# Patient Record
Sex: Male | Born: 1950 | Race: White | Hispanic: No | Marital: Married | Smoking: Never smoker
Health system: Southern US, Community
[De-identification: ages and names within clinical notes are randomized; demographics above are authoritative.]

## PROBLEM LIST (undated history)

## (undated) DIAGNOSIS — K635 Polyp of colon: Secondary | ICD-10-CM

## (undated) DIAGNOSIS — K439 Ventral hernia without obstruction or gangrene: Secondary | ICD-10-CM

## (undated) DIAGNOSIS — E291 Testicular hypofunction: Secondary | ICD-10-CM

## (undated) DIAGNOSIS — Z860100 Personal history of colon polyps, unspecified: Secondary | ICD-10-CM

## (undated) DIAGNOSIS — R55 Syncope and collapse: Secondary | ICD-10-CM

## (undated) DIAGNOSIS — F419 Anxiety disorder, unspecified: Secondary | ICD-10-CM

## (undated) DIAGNOSIS — B958 Unspecified staphylococcus as the cause of diseases classified elsewhere: Secondary | ICD-10-CM

## (undated) DIAGNOSIS — I10 Essential (primary) hypertension: Secondary | ICD-10-CM

## (undated) DIAGNOSIS — E785 Hyperlipidemia, unspecified: Secondary | ICD-10-CM

## (undated) HISTORY — DX: Ventral hernia without obstruction or gangrene: K43.9

## (undated) HISTORY — PX: COLONOSCOPY W/ BIOPSIES: SHX1374

## (undated) HISTORY — DX: Personal history of colon polyps, unspecified: Z86.0100

## (undated) HISTORY — DX: Anxiety disorder, unspecified: F41.9

## (undated) HISTORY — PX: LAPAROSCOPY: SHX197

## (undated) HISTORY — DX: Unspecified staphylococcus as the cause of diseases classified elsewhere: B95.8

## (undated) HISTORY — DX: Personal history of colonic polyps: Z86.010

## (undated) HISTORY — DX: Testicular hypofunction: E29.1

## (undated) HISTORY — DX: Hyperlipidemia, unspecified: E78.5

## (undated) HISTORY — DX: Essential (primary) hypertension: I10

## (undated) HISTORY — DX: Polyp of colon: K63.5

## (undated) HISTORY — PX: OTHER SURGICAL HISTORY: SHX169

---

## 1988-01-01 HISTORY — PX: OTHER SURGICAL HISTORY: SHX169

## 2004-11-22 ENCOUNTER — Ambulatory Visit: Payer: Self-pay | Admitting: Internal Medicine

## 2005-02-28 ENCOUNTER — Ambulatory Visit: Payer: Self-pay | Admitting: Family Medicine

## 2005-09-14 ENCOUNTER — Ambulatory Visit: Payer: Self-pay | Admitting: Family Medicine

## 2005-09-14 ENCOUNTER — Encounter (INDEPENDENT_AMBULATORY_CARE_PROVIDER_SITE_OTHER): Payer: Self-pay | Admitting: Internal Medicine

## 2005-09-14 LAB — CONVERTED CEMR LAB: TSH: 1.03 microintl units/mL

## 2006-02-05 ENCOUNTER — Ambulatory Visit: Payer: Self-pay | Admitting: Family Medicine

## 2006-03-12 ENCOUNTER — Ambulatory Visit: Payer: Self-pay | Admitting: Family Medicine

## 2006-03-20 ENCOUNTER — Ambulatory Visit: Payer: Self-pay | Admitting: Internal Medicine

## 2008-01-15 ENCOUNTER — Ambulatory Visit: Payer: Self-pay | Admitting: Family Medicine

## 2008-01-15 DIAGNOSIS — J069 Acute upper respiratory infection, unspecified: Secondary | ICD-10-CM | POA: Insufficient documentation

## 2008-01-19 ENCOUNTER — Telehealth (INDEPENDENT_AMBULATORY_CARE_PROVIDER_SITE_OTHER): Payer: Self-pay | Admitting: *Deleted

## 2008-11-22 ENCOUNTER — Ambulatory Visit: Payer: Self-pay | Admitting: Urology

## 2009-02-25 ENCOUNTER — Encounter: Payer: Self-pay | Admitting: Internal Medicine

## 2009-02-25 ENCOUNTER — Ambulatory Visit: Payer: Self-pay | Admitting: Family Medicine

## 2009-02-25 DIAGNOSIS — K439 Ventral hernia without obstruction or gangrene: Secondary | ICD-10-CM | POA: Insufficient documentation

## 2009-02-25 DIAGNOSIS — Z86010 Personal history of colonic polyps: Secondary | ICD-10-CM | POA: Insufficient documentation

## 2009-03-07 ENCOUNTER — Encounter (INDEPENDENT_AMBULATORY_CARE_PROVIDER_SITE_OTHER): Payer: Self-pay | Admitting: Internal Medicine

## 2009-04-26 ENCOUNTER — Telehealth (INDEPENDENT_AMBULATORY_CARE_PROVIDER_SITE_OTHER): Payer: Self-pay | Admitting: Internal Medicine

## 2009-12-09 ENCOUNTER — Ambulatory Visit: Payer: Self-pay | Admitting: Internal Medicine

## 2010-01-25 ENCOUNTER — Ambulatory Visit: Payer: Self-pay | Admitting: Family Medicine

## 2010-01-30 ENCOUNTER — Encounter: Admission: RE | Admit: 2010-01-30 | Discharge: 2010-01-30 | Payer: Self-pay | Admitting: Family Medicine

## 2010-02-14 ENCOUNTER — Encounter: Payer: Self-pay | Admitting: Family Medicine

## 2010-02-17 ENCOUNTER — Ambulatory Visit (HOSPITAL_COMMUNITY): Admission: RE | Admit: 2010-02-17 | Discharge: 2010-02-17 | Payer: Self-pay | Admitting: General Surgery

## 2010-02-21 ENCOUNTER — Ambulatory Visit: Payer: Self-pay | Admitting: Family Medicine

## 2010-02-21 DIAGNOSIS — J0120 Acute ethmoidal sinusitis, unspecified: Secondary | ICD-10-CM | POA: Insufficient documentation

## 2010-03-07 ENCOUNTER — Encounter: Payer: Self-pay | Admitting: Family Medicine

## 2010-11-09 ENCOUNTER — Telehealth (INDEPENDENT_AMBULATORY_CARE_PROVIDER_SITE_OTHER): Payer: Self-pay | Admitting: *Deleted

## 2010-11-10 ENCOUNTER — Ambulatory Visit: Payer: Self-pay | Admitting: Family Medicine

## 2010-11-10 LAB — CONVERTED CEMR LAB
Cholesterol: 250 mg/dL — ABNORMAL HIGH (ref 0–200)
GFR calc non Af Amer: 64.76 mL/min (ref >60)
Glucose, Bld: 111 mg/dL — ABNORMAL HIGH (ref 70–99)
HDL: 45.1 mg/dL (ref >39.00)
Potassium: 4.8 meq/L (ref 3.5–5.1)
Sodium: 138 meq/L (ref 135–145)
VLDL: 56.4 mg/dL — ABNORMAL HIGH (ref 0.0–40.0)

## 2010-11-28 ENCOUNTER — Telehealth: Payer: Self-pay | Admitting: Family Medicine

## 2010-11-28 ENCOUNTER — Ambulatory Visit: Payer: Self-pay | Admitting: Family Medicine

## 2010-11-28 DIAGNOSIS — E785 Hyperlipidemia, unspecified: Secondary | ICD-10-CM | POA: Insufficient documentation

## 2010-11-28 DIAGNOSIS — I10 Essential (primary) hypertension: Secondary | ICD-10-CM | POA: Insufficient documentation

## 2010-12-05 ENCOUNTER — Ambulatory Visit: Payer: Self-pay | Admitting: Family Medicine

## 2010-12-06 ENCOUNTER — Encounter: Payer: Self-pay | Admitting: Family Medicine

## 2011-01-04 ENCOUNTER — Encounter: Payer: Self-pay | Admitting: Family Medicine

## 2011-01-04 ENCOUNTER — Ambulatory Visit
Admission: RE | Admit: 2011-01-04 | Discharge: 2011-01-04 | Payer: Self-pay | Source: Home / Self Care | Attending: Family Medicine | Admitting: Family Medicine

## 2011-01-04 ENCOUNTER — Other Ambulatory Visit: Payer: Self-pay | Admitting: Family Medicine

## 2011-01-04 ENCOUNTER — Telehealth: Payer: Self-pay | Admitting: Family Medicine

## 2011-01-04 DIAGNOSIS — D751 Secondary polycythemia: Secondary | ICD-10-CM | POA: Insufficient documentation

## 2011-01-04 DIAGNOSIS — R5381 Other malaise: Secondary | ICD-10-CM | POA: Insufficient documentation

## 2011-01-04 DIAGNOSIS — R5383 Other fatigue: Secondary | ICD-10-CM

## 2011-01-04 DIAGNOSIS — M62838 Other muscle spasm: Secondary | ICD-10-CM | POA: Insufficient documentation

## 2011-01-04 LAB — CBC WITH DIFFERENTIAL/PLATELET
Basophils Absolute: 0 10*3/uL (ref 0.0–0.1)
Basophils Relative: 0.5 % (ref 0.0–3.0)
Eosinophils Absolute: 0.1 10*3/uL (ref 0.0–0.7)
Eosinophils Relative: 2.4 % (ref 0.0–5.0)
HCT: 53.1 % — ABNORMAL HIGH (ref 39.0–52.0)
Hemoglobin: 18.2 g/dL — ABNORMAL HIGH (ref 13.0–17.0)
Lymphocytes Relative: 22.8 % (ref 12.0–46.0)
Lymphs Abs: 1.4 10*3/uL (ref 0.7–4.0)
MCHC: 34.4 g/dL (ref 30.0–36.0)
MCV: 94.8 fl (ref 78.0–100.0)
Monocytes Absolute: 0.8 10*3/uL (ref 0.1–1.0)
Monocytes Relative: 12.9 % — ABNORMAL HIGH (ref 3.0–12.0)
Neutro Abs: 3.7 10*3/uL (ref 1.4–7.7)
Neutrophils Relative %: 61.4 % (ref 43.0–77.0)
Platelets: 388 10*3/uL (ref 150.0–400.0)
RBC: 5.6 Mil/uL (ref 4.22–5.81)
RDW: 14.3 % (ref 11.5–14.6)
WBC: 5.9 10*3/uL (ref 4.5–10.5)

## 2011-01-04 LAB — LDL CHOLESTEROL, DIRECT: Direct LDL: 149.8 mg/dL

## 2011-01-04 LAB — LIPID PANEL
Cholesterol: 233 mg/dL — ABNORMAL HIGH (ref 0–200)
HDL: 43.7 mg/dL (ref >39.00)
Total CHOL/HDL Ratio: 5
Triglycerides: 309 mg/dL — ABNORMAL HIGH (ref 0.0–149.0)
VLDL: 61.8 mg/dL — ABNORMAL HIGH (ref 0.0–40.0)

## 2011-01-05 ENCOUNTER — Ambulatory Visit: Admit: 2011-01-05 | Payer: Self-pay | Admitting: Family Medicine

## 2011-01-17 ENCOUNTER — Telehealth: Payer: Self-pay | Admitting: Family Medicine

## 2011-01-17 ENCOUNTER — Observation Stay (HOSPITAL_COMMUNITY)
Admission: AD | Admit: 2011-01-17 | Discharge: 2011-01-19 | Payer: Self-pay | Attending: Internal Medicine | Admitting: Internal Medicine

## 2011-01-17 ENCOUNTER — Emergency Department: Payer: Self-pay | Admitting: Emergency Medicine

## 2011-01-18 ENCOUNTER — Encounter (INDEPENDENT_AMBULATORY_CARE_PROVIDER_SITE_OTHER): Payer: Self-pay | Admitting: Internal Medicine

## 2011-01-19 ENCOUNTER — Telehealth: Payer: Self-pay | Admitting: Family Medicine

## 2011-01-22 ENCOUNTER — Ambulatory Visit
Admission: RE | Admit: 2011-01-22 | Discharge: 2011-01-22 | Payer: Self-pay | Source: Home / Self Care | Attending: Family Medicine | Admitting: Family Medicine

## 2011-01-22 DIAGNOSIS — F411 Generalized anxiety disorder: Secondary | ICD-10-CM | POA: Insufficient documentation

## 2011-01-22 LAB — DIFFERENTIAL
Basophils Absolute: 0.1 10*3/uL (ref 0.0–0.1)
Basophils Absolute: 0.1 10*3/uL (ref 0.0–0.1)
Basophils Relative: 1 % (ref 0–1)
Eosinophils Absolute: 0.3 10*3/uL (ref 0.0–0.7)
Eosinophils Relative: 2 % (ref 0–5)
Lymphocytes Relative: 18 % (ref 12–46)
Lymphocytes Relative: 21 % (ref 12–46)
Lymphs Abs: 2.3 10*3/uL (ref 0.7–4.0)
Lymphs Abs: 1.8 10*3/uL (ref 0.7–4.0)
Monocytes Absolute: 1 10*3/uL (ref 0.1–1.0)
Monocytes Absolute: 1.1 10*3/uL — ABNORMAL HIGH (ref 0.1–1.0)
Monocytes Relative: 8 % (ref 3–12)
Monocytes Relative: 12 % (ref 3–12)
Neutro Abs: 8.9 10*3/uL — ABNORMAL HIGH (ref 1.7–7.7)
Neutro Abs: 5.5 10*3/uL (ref 1.7–7.7)
Neutrophils Relative %: 71 % (ref 43–77)

## 2011-01-22 LAB — LIPID PANEL
Cholesterol: 236 mg/dL — ABNORMAL HIGH (ref 0–200)
HDL: 53 mg/dL (ref >39)
LDL Cholesterol: 148 mg/dL — ABNORMAL HIGH (ref 0–99)
Total CHOL/HDL Ratio: 4.5 RATIO
Triglycerides: 177 mg/dL — ABNORMAL HIGH (ref <150)
VLDL: 35 mg/dL (ref 0–40)

## 2011-01-22 LAB — COMPREHENSIVE METABOLIC PANEL
ALT: 32 U/L (ref 0–53)
AST: 31 U/L (ref 0–37)
AST: 26 U/L (ref 0–37)
Albumin: 4.2 g/dL (ref 3.5–5.2)
Alkaline Phosphatase: 64 U/L (ref 39–117)
CO2: 25 mEq/L (ref 19–32)
CO2: 29 mEq/L (ref 19–32)
Calcium: 9.3 mg/dL (ref 8.4–10.5)
Chloride: 104 mEq/L (ref 96–112)
GFR calc Af Amer: >60 mL/min (ref >60)
GFR calc Af Amer: >60 mL/min (ref >60)
GFR calc non Af Amer: >60 mL/min (ref >60)
GFR calc non Af Amer: 58 mL/min — ABNORMAL LOW (ref >60)
Potassium: 3.7 mEq/L (ref 3.5–5.1)
Potassium: 4.2 mEq/L (ref 3.5–5.1)
Sodium: 138 mEq/L (ref 135–145)
Total Bilirubin: 0.6 mg/dL (ref 0.3–1.2)

## 2011-01-22 LAB — TSH: TSH: 6.921 u[IU]/mL — ABNORMAL HIGH (ref 0.350–4.500)

## 2011-01-22 LAB — CBC
HCT: 46.7 % (ref 39.0–52.0)
Hemoglobin: 17.2 g/dL — ABNORMAL HIGH (ref 13.0–17.0)
Hemoglobin: 16.6 g/dL (ref 13.0–17.0)
MCH: 32.1 pg (ref 26.0–34.0)
MCH: 32.2 pg (ref 26.0–34.0)
MCHC: 35.5 g/dL (ref 30.0–36.0)
MCV: 89.4 fL (ref 78.0–100.0)
MCV: 90.7 fL (ref 78.0–100.0)
Platelets: 493 10*3/uL — ABNORMAL HIGH (ref 150–400)
RBC: 5.36 MIL/uL (ref 4.22–5.81)
WBC: 12.6 10*3/uL — ABNORMAL HIGH (ref 4.0–10.5)

## 2011-01-22 LAB — CARDIAC PANEL(CRET KIN+CKTOT+MB+TROPI): Total CK: 206 U/L (ref 7–232)

## 2011-01-22 LAB — MRSA PCR SCREENING: MRSA by PCR: NEGATIVE

## 2011-01-22 NOTE — Discharge Summary (Addendum)
NAMEWALDEMAR, SIEGEL NO.:  [id number]  MEDICAL RECORD NO.:  [id number]          PATIENT TYPE:  OBV  LOCATION:  3038                         FACILITY:  [facility]  PHYSICIAN:  Tarry Kos, MD       DATE OF BIRTH:  06-23-1951  DATE OF ADMISSION:  01/17/2011 DATE OF DISCHARGE:  01/19/2011                              DISCHARGE SUMMARY   1. Hypertensive emergency. 2. Rule out left basal ganglia hemorrhage, which was actually     calcification and not an intracranial bleed.  SUMMARY OF HOSPITAL COURSE:  Mr. Brad Cummings is a pleasant 60 year old male, who presented to the emergency room yesterday, who was transferred here from [facility] due to concerns from an abnormal CAT scan that was done there.  He was presented there with uncontrolled blood pressure, had a CAT scan, which showed questionable pinpoint hemorrhage. He was transferred here for further evaluation.  CAT scan was repeated and compared by Radiology Department to the one done at [facility], which the impression was linear density in the left globus pallidus, most compatible with a benign calcification and not an intracranial bleed.  The CT of the head was otherwise negative.  His vital signs here were actually within normal limits.  His blood pressure systolic over diastolic this morning was 108/66.  He has been afebrile.  His vital signs have been stable with hemoglobin of 17.2, white count 12.6.  Electrolytes normal.  BUN and creatinine were normal.  Magnesium level normal.  Cardiac enzymes negative.  Cholesterol high at 236 with a triglyceride 177, HDL 53, LDL 148.  T4 was normal, TSH 6.9, free T3 was also normal.  He also had x- rays of his lumbar spine because of some chronic low back pain, which showed moderate lower lumbar spondylosis with likely secondary L4-L5 malalignment.  There was no acute findings.  The patient is being discharged home today in improved condition.  PHYSICAL  EXAMINATION:  GENERAL:  He is alert and oriented x4.  No apparent distress.  Cooperative fairly. CARDIAC:  Regular rate and rhythm without murmurs or gallops. CHEST:  Clear to auscultation bilaterally.  No wheeze, rhonchi, or rales. ABDOMEN:  Soft, nontender, nondistended.  Positive bowel sounds.  No hepatosplenomegaly. EXTREMITIES:  Without clubbing,  cyanosis, or edema. PSYCHIATRIC:  Normal mood and affect. NEUROLOGIC:  No focal neurologic deficits.  Cranial nerves II through XII grossly intact. 5/5 strength in the upper and lower extremities bilaterally and equal.  He is being discharged to home to follow up with his primary care physician in 1-2 weeks and also to keep a blood pressure long and bring to his primary care physician's office on the following medications: 1. Tylenol as needed. 2. Hydrochlorothiazide 12.5 mg q.a.m. 3. Toprol-XL 25 mg daily. 4. Lipitor 40 mg at bedtime.  On his followup appointment, I would recommend again checking his blood pressure, and he will need continued chronic treatment for his hyperlipidemia.  He is newly started on Metoprolol and the Lipitor.          ______________________________ Tarry Kos, MD     RD/MEDQ  D:  01/19/2011  T:  01/20/2011  Job:  [id number]  Electronically Signed by Eldridge Dace MD on 01/22/2011 01:58:44 PM

## 2011-01-30 NOTE — Letter (Signed)
Summary: Results Follow up Letter  [place] at [facility]  [address] [place], [place] [address]   Phone: [phone]  Fax: [phone]    12/06/2010 MRN: [id number]  [facility] [address] [place], [place]  [address]  Dear Brad Cummings,  The following are the results of your recent test(s):  Test         Result    Pap Smear:        Normal _____  Not Normal _____ Comments: ______________________________________________________ Cholesterol: LDL(Bad cholesterol):         Your goal is less than:         HDL (Good cholesterol):       Your goal is more than: Comments:  ______________________________________________________ Mammogram:        Normal _____  Not Normal _____ Comments:  ___________________________________________________________________ Hemoccult:        Normal __X___  Not normal _______ Comments:    _____________________________________________________________________ Other Tests:    We routinely do not discuss normal results over the telephone.  If you desire a copy of the results, or you have any questions about this information we can discuss them at your next office visit.   Sincerely,      Dr. Ruthe Mannan

## 2011-01-30 NOTE — Letter (Signed)
Summary: [facility]: Immunoassay Fecal Occult Blood (iFOB) Order Form  [place] at [facility]  [address] [place], [place] [address]   Phone: [phone]  Fax: [phone]      [facility]: Immunoassay Fecal Occult Blood (iFOB) Order Form   November 28, 2010 MRN: [id number]   Brad Cummings [birth year]   Physicican Name:_____Talia ARon____________________  Diagnosis Code:______792.1____________________      Ruthe Mannan MD

## 2011-01-30 NOTE — Assessment & Plan Note (Signed)
Summary: ?SINUS INFECTION/CLE   Vital Signs:  Patient profile:   60 year old male Height:      70 inches Weight:      198.38 pounds BMI:     28.57 Temp:     98.6 degrees F oral Pulse rate:   96 / minute Pulse rhythm:   regular BP sitting:   144 / 84  (left arm) Cuff size:   large  Vitals Entered By: Delilah Shan CMA Duncan Dull) (February 21, 2010 9:17 AM) CC: ? sinus infection   History of Present Illness: 60 yo here for 2 weeks of nasal congestion, sinus pressure. Had umbilical hernia repair last week and since being discharged from the hospital feels sinus pressure and congestion is worse. Mildy productive cough. No wheezing, no SOB, no CP. No fevers or chills but does feel fatigued. No sore throat, no n/v/d.  Current Medications (verified): 1)  Bl Maximum One Daily   Tabs (Multiple Vitamins-Minerals) .... Take 1 Tablet By Mouth Once A Day 2)  Ibuprofen 200 Mg Tabs (Ibuprofen) .... As Needed 3)  Augmentin 875-125 Mg Tabs (Amoxicillin-Pot Clavulanate) .Marland Kitchen.. 1 Tab By Mouth Two Times A Day X 10 Days  Allergies (verified): No Known Drug Allergies  Review of Systems      See HPI General:  Complains of malaise; denies chills and fever. ENT:  Complains of nasal congestion, postnasal drainage, and sinus pressure; denies earache, hoarseness, and sore throat. CV:  Denies chest pain or discomfort. Resp:  Complains of cough and sputum productive; denies shortness of breath and wheezing. GI:  Denies abdominal pain, diarrhea, nausea, and vomiting.  Physical Exam  General:  alert, well-developed, well-nourished, and well-hydrated.   Ears:  TMs retracted bilaterally, mod clear fluid Nose:  mucosal erythema.   ethmoid sinuses pos Mouth:  pharyngeal erythema.   Lungs:  Normal respiratory effort, chest expands symmetrically. Lungs are clear to auscultation, no crackles or wheezes. Heart:  Normal rate and regular rhythm. S1 and S2 normal without gallop, murmur, click, rub or other extra  sounds. Extremities:  no edema Cervical Nodes:  No lymphadenopathy noted Psych:  normally interactive and good eye contact.     Impression & Recommendations:  Problem # 1:  ACUTE ETHMOIDAL SINUSITIS (ICD-461.2) Assessment New Given duration of symptoms along with recent discharge from hospital, will treat for bacterial sinusitis. Augmentin x 10 days. Supportive care as per patient instructions. His updated medication list for this problem includes:    Augmentin 875-125 Mg Tabs (Amoxicillin-pot clavulanate) .Marland Kitchen... 1 tab by mouth two times a day x 10 days  Complete Medication List: 1)  Bl Maximum One Daily Tabs (Multiple vitamins-minerals) .... Take 1 tablet by mouth once a day 2)  Ibuprofen 200 Mg Tabs (Ibuprofen) .... As needed 3)  Augmentin 875-125 Mg Tabs (Amoxicillin-pot clavulanate) .Marland Kitchen.. 1 tab by mouth two times a day x 10 days  Patient Instructions: 1)  Take Augmentin as directed.  Drink lots of fluids.  Treat sympotmatically with Mucinex, nasal saline irrigation, and Tylenol/Ibuprofen. Also try claritin D or zyrtec D over the counter- two times a day as needed ( have to sign for them at pharmacy). You can use warm compresses.  Cough suppressant at night. Call if not improving as expected in 5-7 days.  Prescriptions: AUGMENTIN 875-125 MG TABS (AMOXICILLIN-POT CLAVULANATE) 1 tab by mouth two times a day x 10 days  #20 x 0   Entered and Authorized by:   Ruthe Mannan MD   Signed by:  Ruthe Mannan MD on 02/21/2010   Method used:   Electronically to        [facility]* (retail)       [address]       [place], [place]  [address]       Ph: [phone]       Fax: [phone]   RxID:   [id number]   Current Allergies (reviewed today): No known allergies

## 2011-01-30 NOTE — Consult Note (Signed)
Summary: [facility]   Imported By: Lanelle Bal 02/28/2010 08:16:55  _____________________________________________________________________  External Attachment:    Type:   Image     Comment:   External Document

## 2011-01-30 NOTE — Assessment & Plan Note (Signed)
Summary: cpx/alc   Vital Signs:  Patient profile:   60 year old male Height:      70 inches Weight:      199 pounds BMI:     28.66 Temp:     98.1 degrees F oral Pulse rate:   92 / minute Pulse rhythm:   regular BP sitting:   160 / 100  (left arm) Cuff size:   large  Vitals Entered By: Linde Gillis CMA Duncan Dull) (November 28, 2010 9:11 AM) CC: adult complete physicial   History of Present Illness: 60 yo here for CPX.  HLD- reviewed labs with Mr. Berg. TGelevated at 250, LDL 164, HDL 45. Mr. Moses is very physically active- goes to the gym almost daily- cardio and weights. Does not eat fried or canned foods.  Has never had an issue with cholesterol in past. 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 He says he could quit at anytime, it just relaxes him. Mores stress lately- grandchildren live with him now, business has been stressful.  Elevated BP- BP today is 160/100.  At urology office last week, was 160/90. Has had some headaches lately. No blurred vision, CP or LE edema.    Current Medications (verified): 1)  Tylenol Extra Strength 500 Mg Tabs (Acetaminophen) .... Take As Needed 2)  Bc Headache 325-16-95 Mg Tabs (Aspirin-Salicylamide-Caffeine) .... Take As Needed 3)  Hydrochlorothiazide 12.5 Mg Tabs (Hydrochlorothiazide) .Marland Kitchen.. 1 Tab By Mouth Daily.  Allergies (verified): No Known Drug Allergies  Past History:  Past Surgical History: Last updated: 02/25/2009 COLONOSCOPY -- POLYPS BENIGN:(08/08/2000)  Family History: Last updated: 02/25/2009 Father:  Mother:  Siblings:   Social History: Last updated: 02/25/2009 Marital Status: Married LIVES WITH WIFE Children: 1 35 YOA OUT OF HOME Occupation: [employer]  Risk Factors: Alcohol Use: 2 (01/15/2008) Caffeine Use: 1 (01/15/2008) Exercise: yes (01/15/2008)  Risk Factors: Smoking Status: quit (01/15/2008) Passive Smoke Exposure: yes  (01/15/2008)  Review of Systems      See HPI General:  Denies malaise. Eyes:  Denies blurring. ENT:  Denies difficulty swallowing. CV:  Denies chest pain or discomfort. Resp:  Denies shortness of breath. GI:  Denies abdominal pain, bloody stools, and change in bowel habits. GU:  Denies discharge, dysuria, and erectile dysfunction. MS:  Denies joint pain, joint redness, and joint swelling. Derm:  Denies rash. Neuro:  Complains of headaches; denies sensation of room spinning, tingling, tremors, visual disturbances, and weakness. Psych:  Denies anxiety and depression. Endo:  Denies cold intolerance and heat intolerance. Heme:  Denies abnormal bruising and bleeding. Allergy:  Denies hives or rash, itching eyes, persistent infections, seasonal allergies, and sneezing.  Physical Exam  General:  alert, well-developed, well-nourished, and well-hydrated.   hypertensive Head:  normocephalic and atraumatic.   Eyes:  vision grossly intact, pupils equal, pupils round, and pupils reactive to light.   Ears:  R ear normal and L ear normal.   Nose:  no external deformity.   Mouth:  good dentition.   Neck:  No deformities, masses, or tenderness noted. Lungs:  Normal respiratory effort, chest expands symmetrically. Lungs are clear to auscultation, no crackles or wheezes. Heart:  Normal rate and regular rhythm. S1 and S2 normal without gallop, murmur, click, rub or other extra sounds. Abdomen:  Bowel sounds positive,abdomen soft and non-tender without masses, organomegaly or hernias noted. Rectal:  deferred- had prostate exam at urology office last week. Prostate:  deferred- had prostate  exam at urology office last week. Msk:  No deformity or scoliosis noted of thoracic or lumbar spine.   Extremities:  No clubbing, cyanosis, edema, or deformity noted with normal full range of motion of all joints.   Neurologic:  alert & oriented X3.   Skin:  Intact without suspicious lesions or rashes Psych:   Oriented X3, good eye contact, not anxious appearing, and not depressed appearing.     Impression & Recommendations:  Problem # 1:  HEALTH MAINTENANCE EXAM (ICD-V70.0) Reviewed preventive care protocols, scheduled due services, and updated immunizations Discussed nutrition, exercise, diet, and healthy lifestyle.  Stool cards ordered today. Reviewed labs with him.  Problem # 2:  ESSENTIAL HYPERTENSION, BENIGN (ICD-401.1) Assessment: New Will start low dose HCTZ as his diet and exercise are quite good already. Follow up in one month. His updated medication list for this problem includes:    Hydrochlorothiazide 12.5 Mg Tabs (Hydrochlorothiazide) .Marland Kitchen... 1 tab by mouth daily.  Problem # 3:  HYPERLIPIDEMIA (ICD-272.4) Assessment: New Discussed dietary changes.  Pt would like to try cutting back on alcohol before starting a statin. Follow up in one month.  Complete Medication List: 1)  Tylenol Extra Strength 500 Mg Tabs (Acetaminophen) .... Take as needed 2)  Bc Headache 325-16-95 Mg Tabs (Aspirin-salicylamide-caffeine) .... Take as needed 3)  Hydrochlorothiazide 12.5 Mg Tabs (Hydrochlorothiazide) .Marland Kitchen.. 1 tab by mouth daily.  Patient Instructions: 1)  Triglycerides are too high.  Weight loss (even small amount) can decrease triglycerides.  Decrease added sugars, eliminate trans fats, increase fiber and limit alcohol.  All these changes together can drop triglycerides by almost 50%. 2)  Please come see me in one month (do not eat before the appointment). 3)  Have a [employer] Christmas if I do not see you before. Prescriptions: HYDROCHLOROTHIAZIDE 12.5 MG TABS (HYDROCHLOROTHIAZIDE) 1 tab by mouth daily.  #30 x 1   Entered and Authorized by:   Ruthe Mannan MD   Signed by:   Ruthe Mannan MD on 11/28/2010   Method used:   Electronically to        [facility]* (retail)       [address]       [place], [place]  [address]       Ph: [phone]       Fax: [phone]    RxID:   [id number]    Orders Added: 1)  Est. Patient 40-64 years [phone]    Current Allergies (reviewed today): No known allergies   Last Colonoscopy:  Done (08/08/2000 1:08:25 PM) Colonoscopy Result Date:  08/13/2007 Colonoscopy Result:  historical Colonoscopy Next Due:  Refused PSA Next Due:  Not Indicated

## 2011-01-30 NOTE — Assessment & Plan Note (Signed)
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  Was referred to Dr. Dwain Sarna (surgery) in March of last year. Agreed to do mesh repair of his umbilcall hernia and diagnostic laparoscopy of his epiastrium but he did not keep his surgical appointment. He feels he needs a second opinion. The buldge is getting larger and he sometimes has pain when he lifts weights.  He is a former smoker and his mother had a AAA. He wants an ultrasound to look r/o AAA. He is also asking for antibiotics to fix his hernias.  Current Medications (verified): 1)  Bl Maximum One Daily   Tabs (Multiple Vitamins-Minerals) .... Take 1 Tablet By Mouth Once A Day 2)  Ibuprofen 200 Mg Tabs (Ibuprofen) .... As Needed  Allergies (verified): No Known Drug Allergies  Review of Systems      See HPI General:  Denies chills and fever. CV:  Denies chest pain or discomfort. Resp:  Denies cough and shortness of breath. GI:  Complains of abdominal pain; denies bloody stools, change in bowel habits, nausea, and vomiting.  Physical Exam  General:  alert, well-developed, well-nourished, and well-hydrated.   Abdomen:  mass centrally from mid-epigastric area to umbilicus with increase in intra-abd pressure, not visable when lying down.  not tender, 1.5 cm umbilical hernia is easily reduced, obvious diastsis recti Pos BS. Psych:  normally interactive and good eye contact.     Impression & Recommendations:  Problem # 1:  HERNIA, VENTRAL (ICD-553.20) Assessment  Deteriorated Time spent with patient 30  minutes, more than 50% of this time was spent discussing options for his hernia repair. I explained that abx will not help. Typicall we screen for AAA > 86 yo in smokers/former smokers, but given that he wants closer look at his hernia, not unreasonable to get an ultraound.  i explained that this is a hernia and not consistent at all with a AAA.  Pt expressed understanding. Will send for second surgical opinion, however, his umbilical hernia and diastasis recti need to be surgically repaired.  Orders: Radiology Referral (Radiology) Surgical Referral (Surgery)  Complete Medication List: 1)  Bl Maximum One Daily Tabs (Multiple vitamins-minerals) .... Take 1 tablet by mouth once a day 2)  Ibuprofen 200 Mg Tabs (Ibuprofen) .... As needed  Patient Instructions: 1)  Please stop by to see Shirlee Limerick on your way out.  Current Allergies (reviewed today): No known allergies

## 2011-01-30 NOTE — Letter (Signed)
Summary: [facility]   Imported By: Lanelle Bal 04/04/2010 11:16:02  _____________________________________________________________________  External Attachment:    Type:   Image     Comment:   External Document

## 2011-01-30 NOTE — Progress Notes (Signed)
----   Converted from flag ---- ---- 11/09/2010 7:42 AM, Ruthe Mannan MD wrote: BMET v70.0, fasting lipid panel (V81.0)  ---- 11/08/2010 4:41 PM, Mills Koller wrote: This patient is scheduled for CPX with you, I need lab orders with dx, please. Thanks, Terri for friday ------------------------------

## 2011-01-30 NOTE — Progress Notes (Signed)
Summary: needs order for zostavax  Phone Note Call from Patient Call back at Home Phone [phone]   Caller: Patient Summary of Call: Pt is asking for an order for zostavax to be sent to [facility] pharmacy, he has checked with his insurance and they will cover it. Initial call taken by: Lowella Petties CMA, AAMA,  November 28, 2010 10:36 AM    New/Updated Medications: ZOSTAVAX 09811 UNT/0.65ML SOLR (ZOSTER VACCINE LIVE) Use as directed. Prescriptions: ZOSTAVAX 91478 UNT/0.65ML SOLR (ZOSTER VACCINE LIVE) Use as directed.  #1 x 0   Entered and Authorized by:   Ruthe Mannan MD   Signed by:   Ruthe Mannan MD on 11/28/2010   Method used:   Electronically to        [facility]* (retail)       [address]       [place], [place]  [address]       Ph: [phone]       Fax: [phone]   RxID:   [id number]

## 2011-01-31 ENCOUNTER — Encounter: Payer: Self-pay | Admitting: Family Medicine

## 2011-02-01 NOTE — Assessment & Plan Note (Signed)
Summary: 30 min appt follow up/rbh   Vital Signs:  Patient profile:   60 year old male Height:      70 inches Weight:      183 pounds BMI:     26.35 Temp:     97.6 degrees F oral Pulse rate:   86 / minute Pulse rhythm:   regular BP sitting:   160 / 82  (right arm) Cuff size:   regular  Vitals Entered By: Linde Gillis CMA Duncan Dull) (January 22, 2011 8:26 AM) CC: 30 min appt, hopsital follow up   History of Present Illness: 60 yo here for hospital follow up.  Hospital records records reviewed. Initially presented to [facility] on 01/17/2011 bc felt very anxious and BP was in 190s/100s at drug strore.  Head CT done showed ? intracranial bleed and was transferred to [facility] in hypertensive emergency.  At [facility], Head CT was repeated and felt it was a benign calcification and not an intracranial bleed.  BP normalized with benzo and restarting his BP medications.  Brad Cummings feels much better now, very upset because he feels his BP was related more to the anxiety of the situation rather than a medical condition. Has not yet tried the lorazepam at home, wanted to talk to me first about it.  On Toprol XL 25 mg daily and HCTZ 12.5 mg daily for his HTN.  No HA, blurred vision or LE edema.    Remains on Lipitor 40 mg daily for his HLD.  Current Medications (verified): 1)  Tylenol Extra Strength 500 Mg Tabs (Acetaminophen) .... Take As Needed 2)  Hydrochlorothiazide 12.5 Mg  Tabs (Hydrochlorothiazide) .... Take 1 Tab  By Mouth Every Morning 3)  Cyclobenzaprine Hcl 10 Mg  Tabs (Cyclobenzaprine Hcl) .Marland Kitchen.. 1 By Mouth 2 Times Daily As Needed For Back Pain 4)  Lipitor 40 Mg Tabs (Atorvastatin Calcium) .... Take One Tablet By Mouth Daily 5)  Lorazepam 0.5 Mg Tabs (Lorazepam) .Marland Kitchen.. 1 Tab By Mouth Three Times A Day As Needed Anxiety  Allergies (verified): No Known Drug Allergies  Past History:  Past Surgical History: Last updated: 02/25/2009 COLONOSCOPY -- POLYPS BENIGN:(08/08/2000)  Family History: Last  updated: 02/25/2009 Father:  Mother:  Siblings:   Social History: Last updated: 02/25/2009 Marital Status: Married LIVES WITH WIFE Children: 1 35 YOA OUT OF HOME Occupation: [employer]  Risk Factors: Alcohol Use: 2 (01/15/2008) Caffeine Use: 1 (01/15/2008) Exercise: yes (01/15/2008)  Risk Factors: Smoking Status: quit (01/15/2008) Passive Smoke Exposure: yes (01/15/2008)  Review of Systems      See HPI General:  Denies malaise. Eyes:  Denies blurring. CV:  Denies chest pain or discomfort. Resp:  Denies shortness of breath.  Physical Exam  General:  alert, well-developed, well-nourished, and well-hydrated.   hypertensive Lungs:  Normal respiratory effort, chest expands symmetrically. Lungs are clear to auscultation, no crackles or wheezes. Heart:  Normal rate and regular rhythm. S1 and S2 normal without gallop, murmur, click, rub or other extra sounds. Skin:  Intact without suspicious lesions or rashes Psych:  Oriented X3, good eye contact, not anxious appearing, and not depressed appearing.     Impression & Recommendations:  Problem # 1:  ESSENTIAL HYPERTENSION, BENIGN (ICD-401.1) Assessment Deteriorated with episode of hypertenisve emergency most likely related to anxiety.  Continue meds in addition to as needed lorazepam.  Discussed possible dependency with long term use.   His updated medication list for this problem includes:    Hydrochlorothiazide 12.5 Mg Tabs (Hydrochlorothiazide) .Marland Kitchen... Take  1 tab  by mouth every morning    Toprol Xl 25 Mg Tb24 (Metoprolol succinate) .Marland Kitchen... 1 by mouth daily  Problem # 2:  ANXIETY STATE, UNSPECIFIED (ICD-300.00) Assessment: New Discussed daily medicaiton like Paxil but pt would prefer as needed benzo only.   His updated medication list for this problem includes:    Lorazepam 0.5 Mg Tabs (Lorazepam) .Marland Kitchen... 1 tab by mouth three times a day as needed anxiety  Problem # 3:  HYPERLIPIDEMIA (ICD-272.4) Assessment:  Unchanged Continue lipitor 40 mg daily. Follow up in 3 months. His updated medication list for this problem includes:    Lipitor 40 Mg Tabs (Atorvastatin calcium) .Marland Kitchen... Take one tablet by mouth daily  Complete Medication List: 1)  Tylenol Extra Strength 500 Mg Tabs (Acetaminophen) .... Take as needed 2)  Hydrochlorothiazide 12.5 Mg Tabs (Hydrochlorothiazide) .... Take 1 tab  by mouth every morning 3)  Cyclobenzaprine Hcl 10 Mg Tabs (Cyclobenzaprine hcl) .Marland Kitchen.. 1 by mouth 2 times daily as needed for back pain 4)  Lipitor 40 Mg Tabs (Atorvastatin calcium) .... Take one tablet by mouth daily 5)  Lorazepam 0.5 Mg Tabs (Lorazepam) .Marland Kitchen.. 1 tab by mouth three times a day as needed anxiety 6)  Toprol Xl 25 Mg Tb24 (Metoprolol succinate) .Marland Kitchen.. 1 by mouth daily   Orders Added: 1)  Est. Patient Level IV [[address]]    Current Allergies (reviewed today): No known allergies

## 2011-02-01 NOTE — Progress Notes (Signed)
  Phone Note From Other Clinic   Caller: lab tech, Clydie Braun Summary of Call: Patients hgb 18.2, critical high. Initial call taken by: Mills Koller,  January 04, 2011 2:18 PM  Follow-up for Phone Call        Called pt.  Wants to have this done at [facility].  Will forward to [facility].    Additional Follow-up for Phone Call Additional follow up Details #1::        Called patient and sent him to [facility], faxed order to lab dept.  Additional Follow-up by: Carlton Adam,  January 04, 2011 4:08 PM  New Problems: POLYCYTHEMIA (ICD-289.0)   New Problems: POLYCYTHEMIA (ICD-289.0)

## 2011-02-01 NOTE — Progress Notes (Signed)
Summary: pt to go to ER  Phone Note Call from Patient   Caller: Patient Call For: Ruthe Mannan MD Summary of Call: Pt is complaining of headache, sweaty, feels like he is going to pass out, BP is 196/115.  Per Dr. Dayton Martes I advised pt to go to ER.  Pt agreed.           Lowella Petties CMA, AAMA  January 17, 2011 11:44 AM  Initial call taken by: Lowella Petties CMA, AAMA,  January 17, 2011 11:44 AM

## 2011-02-01 NOTE — Progress Notes (Signed)
Summary: wants phone call   Phone Note Call from Patient Call back at Home Phone [phone]   Caller: Wife- Rogelio Seen  Call For: Ruthe Mannan MD Summary of Call: Wife is asking if you could call her. She says that patient went to [facility] ER on the 18th  and they sent him to [facility], he was discharged this morning and wife says that they are both really confused about his meds and really want to talk with you about this.  Initial call taken by: Melody Comas,  January 19, 2011 1:48 PM  Follow-up for Phone Call        Can we please get ER notes and give them a call? thanks, Windell Moulding MD  January 19, 2011 1:54 PM  Spoke with spouse and Julies was given Rxs for Lipitor 40, Toprol 25, and HCTZ 12.5.  Per Dr. Dayton Martes, patient is to take these medications but replace the Simvastatin with the LIpitor.  His wife wants to know if he can have a Rx for Xanax because while in the hospital they said the his blood pressure goes up when he gets upset or anxious.  Please advise.  Linde Gillis CMA Duncan Dull)  January 19, 2011 2:12 PM    Additional Follow-up for Phone Call Additional follow up Details #1::        we can try lorazepam, which is very similar to xanax. please make sure he has hospital follow up scheduled. Ruthe Mannan MD  January 19, 2011 2:13 PM  Rx called to pharmacy, patients wife transferred up front to schedule 30 hospital f/u appt.  Additional Follow-up by: Linde Gillis CMA (AAMA),  January 19, 2011 2:18 PM    New/Updated Medications: LIPITOR 40 MG TABS (ATORVASTATIN CALCIUM) take one tablet by mouth daily LORAZEPAM 0.5 MG TABS (LORAZEPAM) 1 tab by mouth three times a day as needed anxiety Prescriptions: LORAZEPAM 0.5 MG TABS (LORAZEPAM) 1 tab by mouth three times a day as needed anxiety  #60 x 0   Entered and Authorized by:   Ruthe Mannan MD   Signed by:   Linde Gillis CMA (AAMA) on 01/19/2011   Method used:   Telephoned to ...       [facility]* (retail)       [address]       [place], [place]  [address]       Ph: [phone]       Fax: [phone]   RxID:   [id number]   Current Allergies (reviewed today): No known allergies

## 2011-02-01 NOTE — Assessment & Plan Note (Signed)
Summary: FOLLOW-UP AND PATIENT IS FASTING/Brad Cummings   Vital Signs:  Patient profile:   60 year old male Height:      70 inches Weight:      192.50 pounds BMI:     27.72 Temp:     98.1 degrees F oral Pulse rate:   92 / minute Pulse rhythm:   regular BP sitting:   160 / 100  (right arm) Cuff size:   large  Vitals Entered By: Linde Gillis CMA Duncan Dull) (January 04, 2011 8:06 AM) CC: follow up hypertension and HLD   History of Present Illness: 60 yo here for follow up HTN, HLD.  HLD- in November,  TGelevated at 250, LDL 164, HDL 45. [name] is very physically active- goes to the gym almost daily- cardio and weights. Does not eat fried or canned foods.  Has never had an issue with cholesterol in past. Wanted to change diet first.  Here for repeat labs.   Has lost 7 pounds, really working on diet.    Elevated BP- BP elevated again  today at 160/100.  Stopped taking his HCTZ because he felt he did not need it.   No CP, HA, blurred vision or CP. Feels BP is elevated because of neck pain.  Neck pain- seeing  chiropractor for his neck pain.  Chiropracter has done films, per pt, feels it is a trapezius spasms from lifting weights.  No radiculopathy or UE weakness.    Current Medications (verified): 1)  Tylenol Extra Strength 500 Mg Tabs (Acetaminophen) .... Take As Needed 2)  Bc Headache 325-16-95 Mg Tabs (Aspirin-Salicylamide-Caffeine) .... Take As Needed 3)  Hydrochlorothiazide 12.5 Mg  Tabs (Hydrochlorothiazide) .... Take 1 Tab  By Mouth Every Morning 4)  Cyclobenzaprine Hcl 10 Mg  Tabs (Cyclobenzaprine Hcl) .Marland Kitchen.. 1 By Mouth 2 Times Daily As Needed For Back Pain  Allergies (verified): No Known Drug Allergies  Past History:  Past Surgical History: Last updated: 02/25/2009 COLONOSCOPY -- POLYPS BENIGN:(08/08/2000)  Family History: Last updated: 02/25/2009 Father:  Mother:  Siblings:   Social History: Last updated: 02/25/2009 Marital Status: Married LIVES WITH WIFE Children:  1 35 YOA OUT OF HOME Occupation: [employer]  Risk Factors: Alcohol Use: 2 (01/15/2008) Caffeine Use: 1 (01/15/2008) Exercise: yes (01/15/2008)  Risk Factors: Smoking Status: quit (01/15/2008) Passive Smoke Exposure: yes (01/15/2008)  Review of Systems      See HPI MS:  Denies loss of strength and muscle weakness.  Physical Exam  General:  alert, well-developed, well-nourished, and well-hydrated.   hypertensive Lungs:  Normal respiratory effort, chest expands symmetrically. Lungs are clear to auscultation, no crackles or wheezes. Heart:  Normal rate and regular rhythm. S1 and S2 normal without gallop, murmur, click, rub or other extra sounds. Msk:  + trapezius spasms bilaterally Neurologic:  alert & oriented X3.   Psych:  Oriented X3, good eye contact, not anxious appearing, and not depressed appearing.     Impression & Recommendations:  Problem # 1:  HYPERLIPIDEMIA (ICD-272.4) Assessment Unchanged Will recheck lipid panel today.   Orders: Venipuncture (98119) TLB-Lipid Panel (80061-LIPID)  Problem # 2:  ESSENTIAL HYPERTENSION, BENIGN (ICD-401.1) Assessment: Unchanged ADvised restarting his medication.  Pt agreed.   The following medications were removed from the medication list:    Hydrochlorothiazide 12.5 Mg Tabs (Hydrochlorothiazide) .Marland Kitchen... 1 tab by mouth daily. His updated medication list for this problem includes:    Hydrochlorothiazide 12.5 Mg Tabs (Hydrochlorothiazide) .Marland Kitchen... Take 1 tab  by mouth every morning  Problem #  3:  MUSCLE SPASM, TRAPEZIUS (ICD-728.85) Assessment: New Advised getting records from chiropractor, pt to sign medical release form. Flexeril as needed    Complete Medication List: 1)  Tylenol Extra Strength 500 Mg Tabs (Acetaminophen) .... Take as needed 2)  Bc Headache 325-16-95 Mg Tabs (Aspirin-salicylamide-caffeine) .... Take as needed 3)  Hydrochlorothiazide 12.5 Mg Tabs (Hydrochlorothiazide) .... Take 1 tab  by mouth every  morning 4)  Cyclobenzaprine Hcl 10 Mg Tabs (Cyclobenzaprine hcl) .Marland Kitchen.. 1 by mouth 2 times daily as needed for back pain  Other Orders: TLB-CBC Platelet - w/Differential (85025-CBCD) Prescriptions: CYCLOBENZAPRINE HCL 10 MG  TABS (CYCLOBENZAPRINE HCL) 1 by mouth 2 times daily as needed for back pain  #30 x 0   Entered and Authorized by:   Ruthe Mannan MD   Signed by:   Ruthe Mannan MD on 01/04/2011   Method used:   Electronically to        [facility]* (retail)       [address]       [place], [place]  [address]       Ph: [phone]       Fax: [phone]   RxID:   [id number] HYDROCHLOROTHIAZIDE 12.5 MG  TABS (HYDROCHLOROTHIAZIDE) Take 1 tab  by mouth every morning  #90 x 3   Entered and Authorized by:   Ruthe Mannan MD   Signed by:   Ruthe Mannan MD on 01/04/2011   Method used:   Electronically to        [facility]* (retail)       [address]       [place], [place]  [address]       Ph: [phone]       Fax: [phone]   RxID:   [id number]    Orders Added: 1)  Venipuncture [109[address]] 2)  TLB-Lipid Panel [[id number]] 3)  TLB-CBC Platelet - w/Differential [85025-CBCD] 4)  Est. Patient Level IV [35[address]]    Current Allergies (reviewed today): No known allergies

## 2011-02-05 ENCOUNTER — Telehealth: Payer: Self-pay | Admitting: Family Medicine

## 2011-02-06 ENCOUNTER — Telehealth: Payer: Self-pay | Admitting: Family Medicine

## 2011-02-07 NOTE — Miscellaneous (Signed)
Summary: ROI  ROI   Imported By: Kassie Mends 02/02/2011 08:43:09  _____________________________________________________________________  External Attachment:    Type:   Image     Comment:   Scanned Image

## 2011-02-15 NOTE — Progress Notes (Signed)
Summary: can pt take viagra?  Phone Note Call from Patient Call back at Work Phone [phone]   Caller: Patient Call For: Ruthe Mannan MD Summary of Call: Pt is asking if ok for him to take viagra if he is on his blood pressure medicine. Initial call taken by: Lowella Petties CMA, AAMA,  February 05, 2011 10:58 AM  Follow-up for Phone Call        No I typically don't prescribe it with beta blockers, which he is taking. Ruthe Mannan MD  February 05, 2011 11:32 AM  Advised pt. Follow-up by: Lowella Petties CMA, AAMA,  February 05, 2011 12:05 PM

## 2011-02-15 NOTE — Progress Notes (Signed)
Summary: refill request for lorazepam  Phone Note Refill Request Message from:  Fax from Pharmacy  Refills Requested: Medication #1:  LORAZEPAM 0.5 MG TABS 1 tab by mouth three times a day as needed anxiety   Last Refilled: 01/19/2011 Faxed request from [facility] pharmacy, [phone].  Initial call taken by: Lowella Petties CMA, AAMA,  February 06, 2011 12:07 PM  Follow-up for Phone Call        Rx called to pharmacy Follow-up by: Linde Gillis CMA Duncan Dull),  February 07, 2011 8:04 AM    Prescriptions: LORAZEPAM 0.5 MG TABS (LORAZEPAM) 1 tab by mouth three times a day as needed anxiety  #60 x 0   Entered and Authorized by:   Ruthe Mannan MD   Signed by:   Ruthe Mannan MD on 02/07/2011   Method used:   Telephoned to ...       [facility]* (retail)       [address]       [place], [place]  [address]       Ph: [phone]       Fax: [phone]   RxID:   [id number]

## 2011-02-20 ENCOUNTER — Telehealth: Payer: Self-pay | Admitting: Family Medicine

## 2011-02-20 ENCOUNTER — Ambulatory Visit (INDEPENDENT_AMBULATORY_CARE_PROVIDER_SITE_OTHER): Payer: PRIVATE HEALTH INSURANCE | Admitting: Family Medicine

## 2011-02-20 ENCOUNTER — Encounter: Payer: Self-pay | Admitting: Family Medicine

## 2011-02-20 DIAGNOSIS — J0120 Acute ethmoidal sinusitis, unspecified: Secondary | ICD-10-CM

## 2011-02-20 DIAGNOSIS — F411 Generalized anxiety disorder: Secondary | ICD-10-CM

## 2011-02-20 DIAGNOSIS — I10 Essential (primary) hypertension: Secondary | ICD-10-CM

## 2011-02-27 NOTE — Progress Notes (Signed)
Summary: need to change amox  Phone Note From Pharmacy   Caller: [facility]* Summary of Call: Received fax from pharmacy stating that they have on record that pt is allergic to pcn.  I called the pt to verify, he isnt sure about that, has been taking amox that he had left over from last year, but wants to change todays script to something else, just in case.  Please send to [facility]. Initial call taken by: Lowella Petties CMA, AAMA,  February 20, 2011 3:05 PM  Follow-up for Phone Call        ok, sent in Zpack and added PCN to his allergy list. Ruthe Mannan MD  February 21, 2011 7:53 AM    New Allergies: ! PENICILLIN New/Updated Medications: AZITHROMYCIN 250 MG  TABS (AZITHROMYCIN) 2 by  mouth today and then 1 daily for 4 days New Allergies: ! PENICILLINPrescriptions: AZITHROMYCIN 250 MG  TABS (AZITHROMYCIN) 2 by  mouth today and then 1 daily for 4 days  #6 x 0   Entered and Authorized by:   Ruthe Mannan MD   Signed by:   Ruthe Mannan MD on 02/21/2011   Method used:   Electronically to        [facility]* (retail)       [address]       [place], [place]  [address]       Ph: [phone]       Fax: [phone]   RxID:   [id number]

## 2011-02-27 NOTE — Assessment & Plan Note (Signed)
Summary: DISCUSS MEDS   Vital Signs:  Patient profile:   60 year old male Height:      70 inches Weight:      185.50 pounds BMI:     26.71 Temp:     98.0 degrees F oral Pulse rate:   80 / minute Pulse rhythm:   regular BP sitting:   140 / 90  (right arm) Cuff size:   regular  Vitals Entered By: Linde Gillis CMA Duncan Dull) (February 20, 2011 12:25 PM) CC: discuss medications   History of Present Illness: 60 yo here to discuss his anxiety and BP meds.  HTN- BPs have been stable on Toprol XL 25 mg daily and HCTZ 12.5 mg daily for his HTN.  No HA, blurred vision or LE edema.  Feels like Toprol is making him feel bad.  Feels like he has less energy/depressed after he takes it.  Also was told by his urologist that he cannot take it with Vviagra.  Wants to change to another BP medication.  Anxiety- better controlled.  Has not needed the ativan as frequently as he did when he was first sent home from the hospital.  Does feel that when he does take it, however, it is not as effective.  ?sinusitis- sinus pressure and malaise for over two weeks.  Had chills yesterday, no fever that he is aware of.  Cough is mildly productive in the mornings.  Current Medications (verified): 1)  Tylenol Extra Strength 500 Mg Tabs (Acetaminophen) .... Take As Needed 2)  Lisinopril-Hydrochlorothiazide 20-12.5 Mg Tabs (Lisinopril-Hydrochlorothiazide) .Marland Kitchen.. 1 Tab By Mouth Daily. 3)  Cyclobenzaprine Hcl 10 Mg  Tabs (Cyclobenzaprine Hcl) .Marland Kitchen.. 1 By Mouth 2 Times Daily As Needed For Back Pain 4)  Lipitor 40 Mg Tabs (Atorvastatin Calcium) .... Take One Tablet By Mouth Daily 5)  Lorazepam 1 Mg Tabs (Lorazepam) .Marland Kitchen.. 1 Tab By Mouth Three Times A Day As Needed Anxiety 6)  Amoxicillin 500 Mg Tabs (Amoxicillin) .Marland Kitchen.. 1 Tab By Mouth Two Times A Day X 10 Days  Allergies (verified): No Known Drug Allergies  Past History:  Past Surgical History: Last updated: 02/25/2009 COLONOSCOPY -- POLYPS BENIGN:(08/08/2000)  Family  History: Last updated: 02/25/2009 Father:  Mother:  Siblings:   Social History: Last updated: 02/25/2009 Marital Status: Married LIVES WITH WIFE Children: 1 35 YOA OUT OF HOME Occupation: [employer]  Risk Factors: Alcohol Use: 2 (01/15/2008) Caffeine Use: 1 (01/15/2008) Exercise: yes (01/15/2008)  Risk Factors: Smoking Status: quit (01/15/2008) Passive Smoke Exposure: yes (01/15/2008)  Review of Systems      See HPI General:  Complains of chills; denies fever. CV:  Denies chest pain or discomfort.  Physical Exam  General:  alert, well-developed, well-nourished, and well-hydrated.    VSS, non toxic appearing Nose:  boggy turbinates,  right> left Mouth:  good dentition.   Lungs:  Normal respiratory effort, chest expands symmetrically. Lungs are clear to auscultation, no crackles or wheezes. Heart:  Normal rate and regular rhythm. S1 and S2 normal without gallop, murmur, click, rub or other extra sounds. Extremities:  No clubbing, cyanosis, edema, or deformity noted with normal full range of motion of all joints.   Neurologic:  alert & oriented X3.   Psych:  Oriented X3, good eye contact, not anxious appearing, and not depressed appearing.     Impression & Recommendations:  Problem # 1:  ANXIETY STATE, UNSPECIFIED (ICD-300.00) Assessment Improved will increase the dose to 1 mg tid prn anxiety but advised continuing to limit  its use. Pt in agreement with plan.   His updated medication list for this problem includes:    Lorazepam 1 Mg Tabs (Lorazepam) .Marland Kitchen... 1 tab by mouth three times a day as needed anxiety  Problem # 2:  ESSENTIAL HYPERTENSION, BENIGN (ICD-401.1) Assessment: Unchanged stable.  Will d/c toprol today and add lisinopril to HCTZ. Pt to follow up in 2-4 weeks. The following medications were removed from the medication list:    Toprol Xl 25 Mg Tb24 (Metoprolol succinate) .Marland Kitchen... 1 by mouth daily His updated medication list for this problem  includes:    Lisinopril-hydrochlorothiazide 20-12.5 Mg Tabs (Lisinopril-hydrochlorothiazide) .Marland Kitchen... 1 tab by mouth daily.  Problem # 3:  ACUTE ETHMOIDAL SINUSITIS (ICD-461.2) Assessment: New Given duration and progression of symptoms, will treat for bacterial sinusitis with Amoxicillin 500 mg two times a day x 10 days. His updated medication list for this problem includes:    Amoxicillin 500 Mg Tabs (Amoxicillin) .Marland Kitchen... 1 tab by mouth two times a day x 10 days  Complete Medication List: 1)  Tylenol Extra Strength 500 Mg Tabs (Acetaminophen) .... Take as needed 2)  Lisinopril-hydrochlorothiazide 20-12.5 Mg Tabs (Lisinopril-hydrochlorothiazide) .Marland Kitchen.. 1 tab by mouth daily. 3)  Cyclobenzaprine Hcl 10 Mg Tabs (Cyclobenzaprine hcl) .Marland Kitchen.. 1 by mouth 2 times daily as needed for back pain 4)  Lipitor 40 Mg Tabs (Atorvastatin calcium) .... Take one tablet by mouth daily 5)  Lorazepam 1 Mg Tabs (Lorazepam) .Marland Kitchen.. 1 tab by mouth three times a day as needed anxiety 6)  Amoxicillin 500 Mg Tabs (Amoxicillin) .Marland Kitchen.. 1 tab by mouth two times a day x 10 days  Patient Instructions: 1)  Stop taking Toprol and stop taking hydrochlorothiazide. 2)  Start taking Lisinopril/HCTZ. Prescriptions: LORAZEPAM 1 MG TABS (LORAZEPAM) 1 tab by mouth three times a day as needed anxiety  #90 x 0   Entered and Authorized by:   Ruthe Mannan MD   Signed by:   Ruthe Mannan MD on 02/20/2011   Method used:   Print then Give to Patient   RxID:   [id number] AMOXICILLIN 500 MG TABS (AMOXICILLIN) 1 tab by mouth two times a day x 10 days  #20 x 0   Entered and Authorized by:   Ruthe Mannan MD   Signed by:   Ruthe Mannan MD on 02/20/2011   Method used:   Electronically to        [facility]* (retail)       [address]       [place], [place]  [address]       Ph: [phone]       Fax: [phone]   RxID:   [id number] LISINOPRIL-HYDROCHLOROTHIAZIDE 20-12.5 MG TABS (LISINOPRIL-HYDROCHLOROTHIAZIDE) 1 tab by  mouth daily.  #90 x 3   Entered and Authorized by:   Ruthe Mannan MD   Signed by:   Ruthe Mannan MD on 02/20/2011   Method used:   Electronically to        [facility]* (retail)       [address]       [place], [place]  [address]       Ph: [phone]       Fax: [phone]   RxID:   [id number] LORAZEPAM 0.5 MG TABS (LORAZEPAM) 1 tab by mouth three times a day as needed anxiety  #60 x 0   Entered and Authorized by:   Ruthe Mannan MD  Signed by:   Ruthe Mannan MD on 02/20/2011   Method used:   Print then Give to Patient   RxID:   [id number]    Orders Added: 1)  Est. Patient Level IV [[address]]    Current Allergies (reviewed today): No known allergies

## 2011-03-12 ENCOUNTER — Telehealth: Payer: Self-pay | Admitting: Family Medicine

## 2011-03-20 NOTE — Progress Notes (Signed)
Summary: faxed request for flexeril  Phone Note Refill Request Message from:  Fax from Pharmacy  Refills Requested: Medication #1:  CYCLOBENZAPRINE HCL 10 MG  TABS 1 by mouth 2 times daily as needed for back pain   Last Refilled: 01/04/2011 Faxed request from [facility] pharmacy, [phone].  Initial call taken by: Lowella Petties CMA, AAMA,  March 12, 2011 3:43 PM    Prescriptions: CYCLOBENZAPRINE HCL 10 MG  TABS (CYCLOBENZAPRINE HCL) 1 by mouth 2 times daily as needed for back pain  #30 x 0   Entered and Authorized by:   Ruthe Mannan MD   Signed by:   Ruthe Mannan MD on 03/12/2011   Method used:   Electronically to        [facility]* (retail)       [address]       [place], [place]  [address]       Ph: [phone]       Fax: [phone]   RxID:   [id number]

## 2011-03-22 LAB — DIFFERENTIAL
Basophils Relative: 0 % (ref 0–1)
Eosinophils Absolute: 0.3 10*3/uL (ref 0.0–0.7)
Monocytes Relative: 10 % (ref 3–12)
Neutrophils Relative %: 76 % (ref 43–77)

## 2011-03-22 LAB — CBC
MCHC: 34.6 g/dL (ref 30.0–36.0)
MCV: 93.9 fL (ref 78.0–100.0)
Platelets: 225 10*3/uL (ref 150–400)

## 2011-03-22 LAB — BASIC METABOLIC PANEL
BUN: 10 mg/dL (ref 6–23)
CO2: 29 mEq/L (ref 19–32)
Chloride: 102 mEq/L (ref 96–112)
Creatinine, Ser: 1.03 mg/dL (ref 0.4–1.5)

## 2011-03-26 ENCOUNTER — Other Ambulatory Visit: Payer: Self-pay | Admitting: *Deleted

## 2011-03-26 MED ORDER — LORAZEPAM 1 MG PO TABS: 1.0000 mg | ORAL_TABLET | Freq: Three times a day (TID) | ORAL | Status: DC

## 2011-03-27 NOTE — Telephone Encounter (Signed)
Brad Cummings, has this refill been done?  I cant tell.

## 2011-03-28 ENCOUNTER — Other Ambulatory Visit: Payer: Self-pay | Admitting: *Deleted

## 2011-03-28 NOTE — Telephone Encounter (Signed)
Rx was already called to pharmacy.

## 2011-04-23 ENCOUNTER — Other Ambulatory Visit: Payer: Self-pay | Admitting: *Deleted

## 2011-04-23 ENCOUNTER — Telehealth: Payer: Self-pay | Admitting: *Deleted

## 2011-04-23 ENCOUNTER — Other Ambulatory Visit: Payer: Self-pay | Admitting: Family Medicine

## 2011-04-23 MED ORDER — LORAZEPAM 1 MG PO TABS: 1.0000 mg | ORAL_TABLET | Freq: Three times a day (TID) | ORAL | Status: DC

## 2011-04-23 MED ORDER — ESCITALOPRAM OXALATE 10 MG PO TABS: 10.0000 mg | ORAL_TABLET | Freq: Every day | ORAL | Status: DC

## 2011-04-23 NOTE — Telephone Encounter (Signed)
Rx called to Phil at [place].

## 2011-04-23 NOTE — Telephone Encounter (Signed)
Pt has been taking lorazepam as needed for anxiety.  He would like to try something like lexapro that he can take every day, cant remember to take lorazepam every 8 hours, he ends up taking this when he needs it and then has to wait for it to work.  He is asking if he should take both- a lexapro every morning and lorazepam as needed.  Uses [facility].  Please let pt know.

## 2011-04-23 NOTE — Telephone Encounter (Signed)
Left message on machine for patient to return call

## 2011-04-23 NOTE — Telephone Encounter (Signed)
Patient advised as instructed via telephone. 

## 2011-04-23 NOTE — Telephone Encounter (Signed)
Yes we can try Lexapro. It takes a month to get into your system completely, so he will need the Lorazepam as needed at least until this can take effect. I will send rx into [facility] pharmacy.

## 2011-05-07 ENCOUNTER — Telehealth: Payer: Self-pay | Admitting: *Deleted

## 2011-05-07 ENCOUNTER — Other Ambulatory Visit: Payer: Self-pay | Admitting: *Deleted

## 2011-05-07 NOTE — Telephone Encounter (Signed)
Opened in error

## 2011-05-07 NOTE — Telephone Encounter (Signed)
Patient advised as instructed via telephone.  Appt scheduled for 05/08/2011 at 10:45.

## 2011-05-07 NOTE — Telephone Encounter (Signed)
Unlikely from the lexapro. Can he come in to see me?

## 2011-05-07 NOTE — Telephone Encounter (Signed)
Patient says that his blood pressure has been elevated for the past few days. Today it is 150/90 and it has been around that the whole time. He is wondering if it is because of the lexapro and is asking if he should try something else. Uses [facility].

## 2011-05-08 ENCOUNTER — Encounter: Payer: Self-pay | Admitting: Family Medicine

## 2011-05-08 ENCOUNTER — Ambulatory Visit (INDEPENDENT_AMBULATORY_CARE_PROVIDER_SITE_OTHER): Payer: PRIVATE HEALTH INSURANCE | Admitting: Family Medicine

## 2011-05-08 DIAGNOSIS — I10 Essential (primary) hypertension: Secondary | ICD-10-CM

## 2011-05-08 DIAGNOSIS — F411 Generalized anxiety disorder: Secondary | ICD-10-CM

## 2011-05-08 MED ORDER — HYDROCHLOROTHIAZIDE 25 MG PO TABS: 25.0000 mg | ORAL_TABLET | Freq: Every day | ORAL | Status: DC

## 2011-05-08 NOTE — Patient Instructions (Signed)
I hope your brother gets well soon. Please stop taking your current blood pressure medication and start taking the new medication, hydrochlorothiazide 25 mg daily.   Call me next week to let me know how you're feeling.

## 2011-05-09 NOTE — Assessment & Plan Note (Signed)
Improved with Lexapro but pt is concerned about side effects. Discussed side effects with pt, that it takes time for those to decrease and for anxiety symptoms to improve. I do not think Lexapro is affecting his BP, not taking his BP medication is the main contributing factor. After our discussion, Brad Cummings would like to continue Lexapro and call me next week with an update of his symptoms.

## 2011-05-09 NOTE — Progress Notes (Signed)
60 yo here to discuss his anxiety and BP meds.  HTN- BPs have been stable on Lisinopril 20 and HCTZ 12.5 mg daily for his HTN.  No HA, blurred vision or LE edema.  Felt like it was giving him headaches so stopped taking it.  No CP, no SOB or LE edema.  Has known white coat HTN.  Does not check his BP at home, makes him very anxious.    Anxiety- better controlled on Lexapro.  Has not needed the ativan as frequently.  Concerned that Lexapro is making his BP increase and making him feel shaky.  Has only been on Lexapro for a little over a week.    The PMH, PSH, Social History, Family History, Medications, and allergies have been reviewed in [facility], and have been updated if relevant.   Physical Exam BP 160/90  Pulse 75  Temp(Src) 97.9 F (36.6 C) (Oral)  Ht 5\' 10"  (1.778 m)  Wt 182 lb 1.9 oz (82.609 kg)  BMI 26.13 kg/m2  General:  alert, well-developed, well-nourished, and well-hydrated.   Mouth:  good dentition.   Lungs:  Normal respiratory effort, chest expands symmetrically. Lungs are clear to auscultation, no crackles or wheezes. Heart:  Normal rate and regular rhythm. S1 and S2 normal without gallop, murmur, click, rub or other extra sounds. Extremities:  No clubbing, cyanosis, edema, or deformity noted with normal full range of motion of all joints.   Neurologic:  alert & oriented X3.   Psych:  Oriented X3, good eye contact, not anxious appearing, and not depressed appearing.

## 2011-05-09 NOTE — Assessment & Plan Note (Signed)
Deteriorated. BP elevated because not taking his BP medication. Will d/c lisinopril and send in rx for HCTZ 25 mg only. Follow up in 2 weeks.

## 2011-05-25 ENCOUNTER — Other Ambulatory Visit: Payer: Self-pay | Admitting: *Deleted

## 2011-05-25 MED ORDER — LORAZEPAM 1 MG PO TABS: ORAL_TABLET | ORAL | Status: DC

## 2011-05-25 NOTE — Telephone Encounter (Signed)
Rx called to [facility].

## 2011-06-08 ENCOUNTER — Other Ambulatory Visit: Payer: Self-pay | Admitting: *Deleted

## 2011-06-08 MED ORDER — MOMETASONE FUROATE 50 MCG/ACT NA SUSP: 2.0000 | Freq: Every day | NASAL | Status: DC

## 2011-07-10 ENCOUNTER — Other Ambulatory Visit: Payer: Self-pay | Admitting: *Deleted

## 2011-07-10 MED ORDER — LORAZEPAM 1 MG PO TABS: ORAL_TABLET | ORAL | Status: DC

## 2011-07-10 NOTE — Telephone Encounter (Signed)
Received faxed refill request from pharmacy.

## 2011-07-10 NOTE — Telephone Encounter (Signed)
Rx called to [facility]. 

## 2011-07-23 ENCOUNTER — Other Ambulatory Visit: Payer: Self-pay | Admitting: Family Medicine

## 2011-09-10 ENCOUNTER — Other Ambulatory Visit: Payer: Self-pay

## 2011-09-10 MED ORDER — LORAZEPAM 1 MG PO TABS: ORAL_TABLET | ORAL | Status: DC

## 2011-09-10 NOTE — Telephone Encounter (Signed)
[facility] pharmacy faxed refill request Lorazepam 1 mg. Last refilled 07/10/11. Please advise.

## 2011-09-10 NOTE — Telephone Encounter (Signed)
Medication phoned to [facility] pharmacy as instructed.

## 2011-10-22 ENCOUNTER — Other Ambulatory Visit: Payer: Self-pay | Admitting: Family Medicine

## 2011-10-22 NOTE — Telephone Encounter (Signed)
Rx called to [employer] at [facility].

## 2011-11-20 ENCOUNTER — Other Ambulatory Visit: Payer: Self-pay | Admitting: Family Medicine

## 2011-11-20 NOTE — Telephone Encounter (Signed)
Rx called to [facility]. 

## 2011-12-17 ENCOUNTER — Other Ambulatory Visit: Payer: Self-pay | Admitting: Family Medicine

## 2011-12-19 ENCOUNTER — Other Ambulatory Visit: Payer: Self-pay | Admitting: Family Medicine

## 2011-12-19 NOTE — Telephone Encounter (Signed)
Rx called to [facility]. 

## 2012-01-02 ENCOUNTER — Telehealth: Payer: Self-pay | Admitting: Internal Medicine

## 2012-01-02 MED ORDER — AZITHROMYCIN 250 MG PO TABS: ORAL_TABLET | ORAL | Status: DC

## 2012-01-02 NOTE — Telephone Encounter (Signed)
Ok to send in rx for zpack, use as directed with no refills.

## 2012-01-02 NOTE — Telephone Encounter (Signed)
Rx sent to [facility], patient advised via message left on cell phone voicemail.

## 2012-01-02 NOTE — Telephone Encounter (Signed)
Patient states he went to a walk-in clinic for his sinus infection and they Rx him Pencillin and he can't take it because causes stomach issues and wanted to know if we could call him in something else.

## 2012-01-23 ENCOUNTER — Other Ambulatory Visit: Payer: Self-pay | Admitting: Family Medicine

## 2012-01-23 NOTE — Telephone Encounter (Signed)
Rx called to [facility]. 

## 2012-02-12 ENCOUNTER — Telehealth: Payer: Self-pay | Admitting: *Deleted

## 2012-02-12 NOTE — Telephone Encounter (Signed)
He is maxed out on his HCTZ so we would need to start another agent.  Please have him come in for office visit- may need to see another provider since I am out starting tomorrow afternoon for 10 days.

## 2012-02-12 NOTE — Telephone Encounter (Signed)
Spoke with patient and he confirmed that he will be here for his appt tomorrow with Dr. Dayton Martes.

## 2012-02-12 NOTE — Telephone Encounter (Signed)
Called and left a message on patients cell phone voicemail for him to return my call.  Scheduled him an appt to see Dr. Dayton Martes tomorrow morning at 9:45.

## 2012-02-12 NOTE — Telephone Encounter (Signed)
Patient called stating that he has checked his BP several times in the last 3 weeks and BP medication does not seem to be working. Patient states that he has gotten readings of 170/110 and 150/100. Patient states that he uses [facility].

## 2012-02-13 ENCOUNTER — Encounter: Payer: Self-pay | Admitting: Family Medicine

## 2012-02-13 ENCOUNTER — Ambulatory Visit (INDEPENDENT_AMBULATORY_CARE_PROVIDER_SITE_OTHER): Payer: PRIVATE HEALTH INSURANCE | Admitting: Family Medicine

## 2012-02-13 VITALS — BP 160/110 | HR 92 | Temp 98.4°F | Wt 197.2 lb

## 2012-02-13 DIAGNOSIS — I10 Essential (primary) hypertension: Secondary | ICD-10-CM

## 2012-02-13 DIAGNOSIS — F411 Generalized anxiety disorder: Secondary | ICD-10-CM

## 2012-02-13 MED ORDER — LISINOPRIL-HYDROCHLOROTHIAZIDE 20-12.5 MG PO TABS: 1.0000 | ORAL_TABLET | Freq: Every day | ORAL | Status: DC

## 2012-02-13 MED ORDER — ESCITALOPRAM OXALATE 10 MG PO TABS: 10.0000 mg | ORAL_TABLET | Freq: Every day | ORAL | Status: DC

## 2012-02-13 NOTE — Patient Instructions (Addendum)
Good to see you. Please stop taking the hydrochlorothiazide 25 mg daily and start taking- prinizide 20-12.5 mg daily. Keep me posted.  Try Benadryl or Zyrtec for itching.

## 2012-02-13 NOTE — Progress Notes (Signed)
60 yo here to discuss his anxiety and BP meds.  HTN- BPs had been stable on Lisinopril 20 and HCTZ 12.5 mg daily for his HTN but last May we d/c'd lisinopril b/c pt was concerned it was making him too tired.  Has been taking HCTZ 25 mg daily only since last May..   Has known white coat HTN.  Does not check his BP at home, makes him very anxious.   Has been receiving testosterone injections at urology office and BPs have been ranging 140s/70s- 160s/90s per pt.  Anxiety- better controlled on Lexapro.  Has not needed the ativan as frequently.   Patient Active Problem List  Diagnoses  . HYPERLIPIDEMIA  . ESSENTIAL HYPERTENSION, BENIGN  . ACUTE ETHMOIDAL SINUSITIS  . URI  . HERNIA, VENTRAL  . COLONIC POLYPS, BENIGN, HX OF  . MUSCLE SPASM, TRAPEZIUS  . FATIGUE  . POLYCYTHEMIA  . ANXIETY STATE, UNSPECIFIED   No past medical history on file. No past surgical history on file. History  Substance Use Topics  . Smoking status: Never Smoker   . Smokeless tobacco: Not on file  . Alcohol Use: Not on file   No family history on file. Allergies  Allergen Reactions  . Penicillins    Current Outpatient Prescriptions on File Prior to Visit  Medication Sig Dispense Refill  . acetaminophen (TYLENOL) 500 MG tablet Take 500 mg by mouth every 6 (six) hours as needed.        Marland Kitchen atorvastatin (LIPITOR) 40 MG tablet TAKE 1 TABLET EVERY DAY  30 tablet  12  . azithromycin (ZITHROMAX) 250 MG tablet Take two tablets by mouth on day one, then one tablet by mouth on days 2-5  6 each  0  . cyclobenzaprine (FLEXERIL) 10 MG tablet Take 10 mg by mouth 2 (two) times daily as needed.        Marland Kitchen escitalopram (LEXAPRO) 10 MG tablet TAKE 1 TABLET EVERY DAY  30 tablet  6  . hydrochlorothiazide 25 MG tablet Take 1 tablet (25 mg total) by mouth daily.  30 tablet  11  . LORazepam (ATIVAN) 1 MG tablet TAKE ONE TABLET THREE TIMES A DAY IF    NEEDED FOR ANXIETY  90 tablet  1  . mometasone (NASONEX) 50 MCG/ACT nasal spray  Place 2 sprays into the nose daily.  17 g  11    The PMH, PSH, Social History, Family History, Medications, and allergies have been reviewed in [facility], and have been updated if relevant.   Physical Exam BP 160/110  Pulse 92  Temp(Src) 98.4 F (36.9 C) (Oral)  Wt 197 lb 4 oz (89.472 kg)  General:  alert, well-developed, well-nourished, and well-hydrated.   Mouth:  good dentition.   Lungs:  Normal respiratory effort, chest expands symmetrically. Lungs are clear to auscultation, no crackles or wheezes. Heart:  Normal rate and regular rhythm. S1 and S2 normal without gallop, murmur, click, rub or other extra sounds. Extremities:  No clubbing, cyanosis, edema, or deformity noted with normal full range of motion of all joints.   Neurologic:  alert & oriented X3.   Psych:  Oriented X3, good eye contact, not anxious appearing, and not depressed appearing.    Assessment and Plan: 1. Essential hypertension, benign  Deteriorated. Will restart Prinizide at previous dose and d/c HCTZ. The patient indicates understanding of these issues and agrees with the plan.   2. Anxiety state, unspecified  Stable on Lexapro and as needed Ativan.

## 2012-03-04 ENCOUNTER — Telehealth: Payer: Self-pay | Admitting: Family Medicine

## 2012-03-04 NOTE — Telephone Encounter (Signed)
At this point, symptoms likely viral and I would not give abx yet. Please advise supportive care - cont nasonex,  Drink lots of fluids.  Treat sympotmatically with Mucinex, nasal saline irrigation, and Tylenol/Ibuprofen. Also try claritin D or zyrtec D over the counter- two times a day as needed ( have to sign for them at pharmacy). You can use warm compresses.  Cough suppressant at night. Call if not improving as expected in 5-7 days.

## 2012-03-04 NOTE — Telephone Encounter (Signed)
Advised pt.  He didn't agree with advise, says he knows he has a sinus infection and needs z-pack.  He said he would go to urgent care, which he says is easier for him then to come here for appt.

## 2012-03-04 NOTE — Telephone Encounter (Signed)
Triage Record Num: [id number] Operator: Richardean Chimera Patient Name: Brad Cummings Call Date & Time: 03/04/2012 11:52:00AM Patient Phone: [phone] PCP: Ruthe Mannan Patient Gender: Male PCP Fax : [phone] Patient DOB: [birth year] Practice Name: [facility] Reason for Call: Caller: Brad Cummings/Patient; PCP: Ruthe Mannan [facility]); CB#: [phone]; Call regarding sinus sxs - requesting Zpac. Onset: 03/02/12; tenderness over cheeks; sinus burning; blowing yellow mucus and sm. of blood; afebrile. Nasonex w/ some relief of sxs; Tylenol w/out improvment. All emergent sxs per Upper Resiratory Infection protoocl r/o with exception to 'Mild to moderate h/a for more than 24 hrs unrelieved with non-prescription medications'. Pt. offered an appt for evaluation of sxs and declined as 'I was just in there last month for same sxs. I just want a Zpac called in.' RN offered an appt or option to send note to see if Rx would be called in and pt. decided to have note sent. If Rx called in please send to [facility] [phone]. PLEASE CALL PT. EITHER WAY AS TO WHETHER RX TO BE CALLED IN OR NEED TO BE EVALUATED. Protocol(s) Used: Upper Respiratory Infection (URI) Recommended Outcome per Protocol: See Provider within 24 hours Reason for Outcome: Mild to moderate headache for more than 24 hours unrelieved with nonprescription medications Care Advice: ~ Use a cool mist humidifier to moisten air. Be sure to clean according to manufacturer's instructions. Call provider if headache worsens, develops temperature greater than 101.71F (38.1C) or any temperature elevation in a geriatric or immunocompromised patient (such as diabetes, HIV/AIDS, chemotherapy, organ transplant, or chronic steroid use). ~ ~ SYMPTOM / CONDITION MANAGEMENT ~ CAUTIONS A warm, moist compress placed on face, over eyes for 15 to 20 minutes, 5 to 6 times a day, may help relieve the congestion. ~ Most adults need to drink  6-10 eight-ounce glasses (1.2-2.0 liters) of fluids per day unless previously told to limit fluid intake for other medical reasons. Limit fluids that contain caffeine, sugar or alcohol. Urine will be a very light yellow color when you drink enough fluids. ~ Analgesic/Antipyretic Advice - Acetaminophen: Consider acetaminophen as directed on label or by pharmacist/provider for pain or fever. PRECAUTIONS: - Use only if there is no history of liver disease, alcoholism, or intake of three or more alcohol drinks per day. - If approved by provider when breastfeeding. - Do not exceed recommended dose or frequency. ~ 03/04/2012 12:08:29PM Page 1 of 1 [id number]

## 2012-03-19 ENCOUNTER — Other Ambulatory Visit: Payer: Self-pay | Admitting: Family Medicine

## 2012-03-19 NOTE — Telephone Encounter (Signed)
Ativan called to pharmacy.

## 2012-05-19 ENCOUNTER — Other Ambulatory Visit: Payer: Self-pay | Admitting: Family Medicine

## 2012-05-19 NOTE — Telephone Encounter (Signed)
Medicine called to pharmacy. 

## 2012-05-28 ENCOUNTER — Other Ambulatory Visit: Payer: Self-pay | Admitting: *Deleted

## 2012-05-28 NOTE — Telephone Encounter (Signed)
Who is that provider?  Has he established with another practice?  If not, ok to refill flonase.  If he is being managed by another provider, he needs to request refills from that provider.

## 2012-05-28 NOTE — Telephone Encounter (Signed)
Received faxed refill request from [facility] for flonase.  Pt has previously been on nasonex.  I called the pharmacy and was told pt had been switched to flonase by another provider on 03/08/12 because it has a less expensive copay.  The other provider is Irish Lack, FNP.  OK to refill?

## 2012-05-29 NOTE — Telephone Encounter (Signed)
Left message on cell phone voice mail asking pt to call back.

## 2012-06-06 ENCOUNTER — Telehealth: Payer: Self-pay

## 2012-06-06 NOTE — Telephone Encounter (Signed)
[facility] pharmacy faxed request Fluticasone propiona 50 mcg/inh nose aer. # 16  To use two puffs into each nostril daily. Not on med list.Please advise.

## 2012-06-06 NOTE — Telephone Encounter (Signed)
I left several messages for pt to call me back about this medicine the last time we got a request- about a week or two ago.  He did not.  Advised pharmacy to check with the patient about this and ask him to call us.

## 2012-06-10 ENCOUNTER — Other Ambulatory Visit: Payer: Self-pay | Admitting: *Deleted

## 2012-06-10 MED ORDER — FLUTICASONE PROPIONATE 50 MCG/ACT NA SUSP: 2.0000 | Freq: Every day | NASAL | Status: DC

## 2012-06-10 NOTE — Telephone Encounter (Signed)
Pt wants to change to flonase from nasonex, due to cost.  He had originally gotten this from nurse practitioner at an urgent care.  Same request came in on 5/29 and Dr Dayton Martes had said ok to refill unless pt had changed providers.  I have been unable to reach patient, but pharmacy says they think pt only saw nurse practitioner once, and that's when the medicine was changed to less expensive flonase.

## 2012-07-04 ENCOUNTER — Other Ambulatory Visit: Payer: Self-pay | Admitting: *Deleted

## 2012-07-04 NOTE — Telephone Encounter (Signed)
This appears to be early.  #90 with 1rf on 05/19/12.  I'll defer to PCP.

## 2012-07-06 MED ORDER — LORAZEPAM 1 MG PO TABS: 1.0000 mg | ORAL_TABLET | Freq: Three times a day (TID) | ORAL | Status: DC | PRN

## 2012-07-06 NOTE — Telephone Encounter (Signed)
Ok to refill 

## 2012-07-07 NOTE — Telephone Encounter (Signed)
Medicine called to pharmacy. 

## 2012-08-18 ENCOUNTER — Other Ambulatory Visit: Payer: Self-pay | Admitting: Family Medicine

## 2012-08-18 NOTE — Telephone Encounter (Signed)
Medicine called to [facility]. 

## 2012-09-12 ENCOUNTER — Other Ambulatory Visit: Payer: Self-pay | Admitting: *Deleted

## 2012-09-12 MED ORDER — LORAZEPAM 1 MG PO TABS: ORAL_TABLET | ORAL | Status: DC

## 2012-09-12 NOTE — Telephone Encounter (Signed)
Medicine called to [facility]. 

## 2012-10-01 ENCOUNTER — Other Ambulatory Visit: Payer: Self-pay | Admitting: Family Medicine

## 2012-10-01 NOTE — Telephone Encounter (Signed)
Medicine called to pharmacy. 

## 2012-10-14 ENCOUNTER — Other Ambulatory Visit: Payer: Self-pay | Admitting: Family Medicine

## 2012-10-15 ENCOUNTER — Encounter: Payer: Self-pay | Admitting: Urology

## 2012-10-15 LAB — HEMOGLOBIN: HGB: 17.5 g/dL (ref 13.0–18.0)

## 2012-10-15 LAB — HEMATOCRIT: HCT: 50.2 % (ref 40.0–52.0)

## 2012-10-23 ENCOUNTER — Other Ambulatory Visit: Payer: Self-pay | Admitting: Family Medicine

## 2012-10-23 NOTE — Telephone Encounter (Signed)
Medicine called to pharmacy. 

## 2012-10-31 ENCOUNTER — Encounter: Payer: Self-pay | Admitting: Urology

## 2012-11-14 ENCOUNTER — Other Ambulatory Visit: Payer: Self-pay | Admitting: *Deleted

## 2012-11-14 MED ORDER — LORAZEPAM 1 MG PO TABS: ORAL_TABLET | ORAL | Status: DC

## 2012-11-14 NOTE — Telephone Encounter (Signed)
Medicine called to [facility]. 

## 2012-11-14 NOTE — Telephone Encounter (Signed)
Faxed refill request from [facility], last filled 10/23/12.

## 2012-12-01 ENCOUNTER — Other Ambulatory Visit: Payer: Self-pay | Admitting: *Deleted

## 2012-12-01 MED ORDER — LORAZEPAM 1 MG PO TABS: ORAL_TABLET | ORAL | Status: DC

## 2012-12-01 NOTE — Telephone Encounter (Signed)
Faxed refill request from [facility], last filled 11/14/12.

## 2012-12-01 NOTE — Telephone Encounter (Signed)
Medicine called to [facility]. 

## 2012-12-19 ENCOUNTER — Other Ambulatory Visit: Payer: Self-pay | Admitting: *Deleted

## 2012-12-19 MED ORDER — LORAZEPAM 1 MG PO TABS: ORAL_TABLET | ORAL | Status: DC

## 2012-12-19 NOTE — Telephone Encounter (Signed)
Faxed refill request from [facility], last filled 12/01/12.

## 2012-12-22 NOTE — Telephone Encounter (Signed)
Medicine called to [facility]. 

## 2013-01-13 ENCOUNTER — Encounter: Payer: Self-pay | Admitting: Family Medicine

## 2013-01-13 ENCOUNTER — Ambulatory Visit (INDEPENDENT_AMBULATORY_CARE_PROVIDER_SITE_OTHER): Payer: PRIVATE HEALTH INSURANCE | Admitting: Family Medicine

## 2013-01-13 VITALS — BP 142/84 | HR 84 | Temp 97.8°F | Wt 194.0 lb

## 2013-01-13 DIAGNOSIS — E785 Hyperlipidemia, unspecified: Secondary | ICD-10-CM

## 2013-01-13 DIAGNOSIS — I10 Essential (primary) hypertension: Secondary | ICD-10-CM

## 2013-01-13 DIAGNOSIS — F411 Generalized anxiety disorder: Secondary | ICD-10-CM

## 2013-01-13 LAB — COMPREHENSIVE METABOLIC PANEL
Albumin: 4.2 g/dL (ref 3.5–5.2)
Alkaline Phosphatase: 62 U/L (ref 39–117)
BUN: 10 mg/dL (ref 6–23)
CO2: 29 mEq/L (ref 19–32)
Calcium: 9.2 mg/dL (ref 8.4–10.5)
GFR: 57.66 mL/min — ABNORMAL LOW (ref >60.00)
Glucose, Bld: 87 mg/dL (ref 70–99)
Potassium: 4.4 mEq/L (ref 3.5–5.1)
Total Protein: 7.5 g/dL (ref 6.0–8.3)

## 2013-01-13 LAB — LIPID PANEL
Cholesterol: 180 mg/dL (ref 0–200)
Total CHOL/HDL Ratio: 4
Triglycerides: 359 mg/dL — ABNORMAL HIGH (ref 0.0–149.0)

## 2013-01-13 MED ORDER — LORAZEPAM 1 MG PO TABS: ORAL_TABLET | ORAL | Status: DC

## 2013-01-13 MED ORDER — ESCITALOPRAM OXALATE 20 MG PO TABS: 20.0000 mg | ORAL_TABLET | Freq: Every day | ORAL | Status: DC

## 2013-01-13 MED ORDER — AZITHROMYCIN 250 MG PO TABS: ORAL_TABLET | ORAL | Status: DC

## 2013-01-13 NOTE — Progress Notes (Addendum)
62 Cummings here to discuss his anxiety and BP meds.  HTN- BPs had been stable on Lisinopril Brad and HCTZ 12.5 mg daily.  Denies any HA, blurred vision, CP, SOB or LE edema.     Anxiety- was better controlled on Lexapro 10 mg daily but lately has been under more stress and taking Lorazepam 1 mg more frequently than his prescribed directions of three times daily.  Having panic attacks.  No SI or HI.  ?sinsusitis- past month or so, has a runny nose, congestion every morning.  He think he has a sinus infection.  Sometimes has frontal sinus pressure.  No cough, no SOB, no fever. He is allergic to long hair dogs.  He just adopted two cats prior to these symptoms starting.  Patient Active Problem List  Diagnosis  . HYPERLIPIDEMIA  . ESSENTIAL HYPERTENSION, BENIGN  . HERNIA, VENTRAL  . COLONIC POLYPS, BENIGN, HX OF  . MUSCLE SPASM, TRAPEZIUS  . FATIGUE  . POLYCYTHEMIA  . ANXIETY STATE, UNSPECIFIED   No past medical history on file. No past surgical history on file. History  Substance Use Topics  . Smoking status: Never Smoker   . Smokeless tobacco: Not on file  . Alcohol Use: Not on file   No family history on file. Allergies  Allergen Reactions  . Penicillins    Current Outpatient Prescriptions on File Prior to Visit  Medication Sig Dispense Refill  . acetaminophen (TYLENOL) 500 MG tablet Take 500 mg by mouth every 6 (six) hours as needed.        Marland Kitchen atorvastatin (LIPITOR) 40 MG tablet TAKE 1 TABLET EVERY DAY  30 tablet  12  . cyclobenzaprine (FLEXERIL) 10 MG tablet TAKE ONE TABLET TWICE A DAY IF NEEDED   FOR BACK PAIN  30 tablet  1  . escitalopram (LEXAPRO) 10 MG tablet TAKE 1 TABLET EVERY DAY  30 tablet  4  . fluticasone (FLONASE) 50 MCG/ACT nasal spray Place 2 sprays into the nose daily.  16 g  1  . lisinopril-hydrochlorothiazide (PRINZIDE,ZESTORETIC) Brad-12.5 MG per tablet Take 1 tablet by mouth daily.  30 tablet  6  . LORazepam (ATIVAN) 1 MG tablet Take one tablet by mouth three times  a day as needed for anxiety.  90 tablet  0  . mometasone (NASONEX) 50 MCG/ACT nasal spray Place 2 sprays into the nose daily.  17 g  11    The PMH, PSH, Social History, Family History, Medications, and allergies have been reviewed in [facility], and have been updated if relevant.   Physical Exam BP 142/84  Pulse 84  Temp 97.8 F (36.6 C)  Wt 194 lb (87.998 kg)  General:  alert, well-developed, well-nourished, and well-hydrated.   Mouth:  good dentition, +pos PND  Nose: Boggy turbinates, sinuses NTTP Lungs:  Normal respiratory effort, chest expands symmetrically. Lungs are clear to auscultation, no crackles or wheezes. Heart:  Normal rate and regular rhythm. S1 and S2 normal without gallop, murmur, click, rub or other extra sounds. Extremities:  No clubbing, cyanosis, edema, or deformity noted with normal full range of motion of all joints.   Neurologic:  alert & oriented X3.   Psych:  Oriented X3, good eye contact, not anxious appearing, and not depressed appearing.    Assessment and Plan: 1. Essential hypertension, benign  Stable.  Continue current meds.  Check CMET today.   2. Anxiety state, unspecified  Deteriorated.  Had a long discussion with Mr. Lembke about dependency on ativan.  He repeatedly  asked me to increase dose of Ativan but I explained to him why I will not be increasing the dose of his Ativan.  We will increase his dose of Lexapro today and continue current dose of Ativan.  The patient indicates understanding of these issues and agrees with the plan.    3.  Allergic rhinitis-  I suspect he is allergic to cats.  He does not want to give them away so I advised allegra and continued flonase.  Given rx for Zpack to fill [facility] allergic) only if sx worsen. The patient indicates understanding of these issues and agrees with the plan.

## 2013-01-13 NOTE — Patient Instructions (Addendum)
Good to see you. I do think you have allergies from your cats.  Continue Allegra and Flonase.  We will call you with your lab results.

## 2013-01-15 ENCOUNTER — Telehealth: Payer: Self-pay | Admitting: *Deleted

## 2013-01-15 DIAGNOSIS — E785 Hyperlipidemia, unspecified: Secondary | ICD-10-CM

## 2013-01-15 MED ORDER — FENOFIBRATE 48 MG PO TABS: 48.0000 mg | ORAL_TABLET | Freq: Every day | ORAL | Status: DC

## 2013-01-15 NOTE — Telephone Encounter (Signed)
Rx for tricor sent.  Please ask him to STOP taking Lipitor for now and just take Tricor.  Repeat Lipid panel and cmet in 4 weeks.  I will place order.

## 2013-01-15 NOTE — Telephone Encounter (Signed)
Advised pt.  He does want to try tricor, uses [facility].

## 2013-01-15 NOTE — Telephone Encounter (Signed)
Yes testosterone can affect your cholesterol but it also may be a component of genetics if there really is no way to improve diet. Lipitor does help with triglycerides somewhat.  Was he completely fasting for these labs?

## 2013-01-15 NOTE — Telephone Encounter (Signed)
Advised patient as instructed.  He had fasted prior to labwork.

## 2013-01-15 NOTE — Telephone Encounter (Signed)
Advised patient of lab results, elevated trigs.  He's asking if he should be on medicine for this, says he cant improve on his diet- rarely eats the wrong things.  Also asks if his testosterone replacement can be causing this.  Please advise.

## 2013-01-15 NOTE — Telephone Encounter (Signed)
Advised patient as instructed, lab appt scheduled.

## 2013-01-15 NOTE — Telephone Encounter (Signed)
Noted.  We do have the option of decreasing his lipitor and adding tricor for triglycerides.  Let me know and I will send to pharmacy.

## 2013-01-16 ENCOUNTER — Other Ambulatory Visit: Payer: Self-pay | Admitting: Family Medicine

## 2013-02-10 ENCOUNTER — Other Ambulatory Visit: Payer: Self-pay | Admitting: *Deleted

## 2013-02-10 ENCOUNTER — Other Ambulatory Visit: Payer: Self-pay | Admitting: Family Medicine

## 2013-02-10 MED ORDER — LORAZEPAM 1 MG PO TABS: ORAL_TABLET | ORAL | Status: DC

## 2013-02-10 NOTE — Telephone Encounter (Signed)
Medicine called to [facility] pharmacy.

## 2013-02-10 NOTE — Telephone Encounter (Signed)
Last filled 01/13/13

## 2013-02-12 ENCOUNTER — Other Ambulatory Visit: Payer: PRIVATE HEALTH INSURANCE

## 2013-02-17 ENCOUNTER — Other Ambulatory Visit: Payer: PRIVATE HEALTH INSURANCE

## 2013-03-09 ENCOUNTER — Other Ambulatory Visit: Payer: Self-pay | Admitting: Family Medicine

## 2013-03-09 NOTE — Telephone Encounter (Signed)
Pt is overdue for lab work to recheck lipids and renal panel.

## 2013-03-09 NOTE — Telephone Encounter (Signed)
Lorazepam called to [facility].

## 2013-03-26 ENCOUNTER — Other Ambulatory Visit: Payer: Self-pay | Admitting: *Deleted

## 2013-03-26 MED ORDER — LISINOPRIL-HYDROCHLOROTHIAZIDE 20-12.5 MG PO TABS: 1.0000 | ORAL_TABLET | Freq: Every day | ORAL | Status: DC

## 2013-03-31 ENCOUNTER — Other Ambulatory Visit: Payer: Self-pay | Admitting: Family Medicine

## 2013-04-01 NOTE — Telephone Encounter (Signed)
Medicine called to [facility].

## 2013-04-23 ENCOUNTER — Other Ambulatory Visit: Payer: Self-pay | Admitting: Family Medicine

## 2013-04-28 ENCOUNTER — Other Ambulatory Visit: Payer: Self-pay | Admitting: *Deleted

## 2013-04-28 MED ORDER — LORAZEPAM 1 MG PO TABS: ORAL_TABLET | ORAL | Status: DC

## 2013-04-28 NOTE — Telephone Encounter (Signed)
Medicine called to [facility]. 

## 2013-04-28 NOTE — Telephone Encounter (Signed)
Last filled 04/01/13 

## 2013-05-26 ENCOUNTER — Other Ambulatory Visit: Payer: Self-pay | Admitting: *Deleted

## 2013-05-26 ENCOUNTER — Encounter: Payer: Self-pay | Admitting: Radiology

## 2013-05-26 MED ORDER — LORAZEPAM 1 MG PO TABS: ORAL_TABLET | ORAL | Status: DC

## 2013-05-26 NOTE — Telephone Encounter (Signed)
Called to [facility].

## 2013-05-26 NOTE — Telephone Encounter (Signed)
Faxed request from [facility], last filled 04/23/13.

## 2013-05-27 ENCOUNTER — Ambulatory Visit (INDEPENDENT_AMBULATORY_CARE_PROVIDER_SITE_OTHER): Payer: PRIVATE HEALTH INSURANCE | Admitting: Family Medicine

## 2013-05-27 ENCOUNTER — Encounter: Payer: Self-pay | Admitting: Family Medicine

## 2013-05-27 ENCOUNTER — Ambulatory Visit (INDEPENDENT_AMBULATORY_CARE_PROVIDER_SITE_OTHER)
Admission: RE | Admit: 2013-05-27 | Discharge: 2013-05-27 | Disposition: A | Discharge status: Home / Self Care | Payer: PRIVATE HEALTH INSURANCE | Source: Ambulatory Visit | Attending: Family Medicine | Admitting: Family Medicine

## 2013-05-27 VITALS — BP 148/86 | HR 92 | Temp 97.9°F | Wt 192.0 lb

## 2013-05-27 DIAGNOSIS — M25519 Pain in unspecified shoulder: Secondary | ICD-10-CM

## 2013-05-27 DIAGNOSIS — M25512 Pain in left shoulder: Secondary | ICD-10-CM

## 2013-05-27 MED ORDER — DEXAMETHASONE SOD PHOSPHATE PF 10 MG/ML IJ SOLN
10.0000 mg | Freq: Once | INTRAMUSCULAR | Status: AC
  Administered 2013-05-27: 10 mg via INTRAMUSCULAR

## 2013-05-27 NOTE — Addendum Note (Signed)
Addended by: Eliezer Bottom on: 05/27/2013 11:31 AM   Modules accepted: Orders

## 2013-05-27 NOTE — Addendum Note (Signed)
Addended by: Dianne Dun on: 05/27/2013 11:37 AM   Modules accepted: Orders

## 2013-05-27 NOTE — Progress Notes (Addendum)
SUBJECTIVE: Brad Cummings is a 62 y.o. male here for left shoulder pain.  3 weeks ago, sustained a left shoulder injury.  He was docking a boat and pulling on rope with left arm.  Immediately felt something "pull."  Since then, cannot touch his back.  No swelling.     Has had intermittent shoulder pain for years.  Sees a Land. Patient Active Problem List   Diagnosis Date Noted  . Left shoulder pain 05/27/2013  . ANXIETY STATE, UNSPECIFIED 01/22/2011  . MUSCLE SPASM, TRAPEZIUS 01/04/2011  . FATIGUE 01/04/2011  . POLYCYTHEMIA 01/04/2011  . HYPERLIPIDEMIA 11/28/2010  . ESSENTIAL HYPERTENSION, BENIGN 11/28/2010  . HERNIA, VENTRAL 02/25/2009  . COLONIC POLYPS, BENIGN, HX OF 02/25/2009   No past medical history on file. No past surgical history on file. History  Substance Use Topics  . Smoking status: Never Smoker   . Smokeless tobacco: Not on file  . Alcohol Use: Not on file   No family history on file. Allergies  Allergen Reactions  . Penicillins    Current Outpatient Prescriptions on File Prior to Visit  Medication Sig Dispense Refill  . acetaminophen (TYLENOL) 500 MG tablet Take 500 mg by mouth every 6 (six) hours as needed.        Marland Kitchen azithromycin (ZITHROMAX) 250 MG tablet Take two tablets by mouth on day one, then one tablet by mouth on days 2-5  6 each  0  . cyclobenzaprine (FLEXERIL) 10 MG tablet TAKE ONE TABLET TWICE A DAY IF NEEDED   FOR BACK PAIN  30 tablet  1  . escitalopram (LEXAPRO) 20 MG tablet Take 1 tablet (20 mg total) by mouth daily.  30 tablet  4  . fenofibrate (TRICOR) 48 MG tablet TAKE 1 TABLET EVERY DAY  30 tablet  6  . fluticasone (FLONASE) 50 MCG/ACT nasal spray USE 2 SPRAYS IN EACH NOSTRIL EVERY DAY  16 g  5  . lisinopril-hydrochlorothiazide (PRINZIDE,ZESTORETIC) 20-12.5 MG per tablet TAKE ONE TABLET EVERY MORNING  30 tablet  5  . LORazepam (ATIVAN) 1 MG tablet TAKE ONE TABLET THREE TIMES A DAY IF NEEDED FOR ANXIETY  90 tablet  0  . mometasone  (NASONEX) 50 MCG/ACT nasal spray Place 2 sprays into the nose daily.       No current facility-administered medications on file prior to visit.   The PMH, PSH, Social History, Family History, Medications, and allergies have been reviewed in [facility], and have been updated if relevant.  OBJECTIVE: BP 148/86  Pulse 92  Temp(Src) 97.9 F (36.6 C)  Wt 192 lb (87.091 kg)  BMI 27.55 kg/m2  Appearance: alert, well appearing, and in no distress, oriented to person, place, and time and normal appearing weight. Shoulder exam: Left shoulder- normal to inspection and palpation, neg arch sign,  +empty can, unable to touch thumb to back.  X-ray: ordered, but results not yet available.  ASSESSMENT: Shoulder impingement with ? 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  PLAN: rest the injured area as much as practical, X-Ray ordered.  Will likely need MRI.  IM decadron given in office today to decrease inflammation. See orders for this visit as documented in the electronic medical record.

## 2013-05-27 NOTE — Patient Instructions (Addendum)
Good to see you. You have rotator cuff impingement Try to avoid painful activities (overhead activities, lifting with extended arm) as much as possible. We have given you a steroid injection today. I will call you with your xray results.  Ok to take Tylenol.

## 2013-06-08 ENCOUNTER — Encounter: Payer: Self-pay | Admitting: Family Medicine

## 2013-06-08 ENCOUNTER — Other Ambulatory Visit: Payer: Self-pay | Admitting: *Deleted

## 2013-06-08 MED ORDER — FLUTICASONE PROPIONATE 50 MCG/ACT NA SUSP: NASAL | Status: DC

## 2013-06-09 ENCOUNTER — Other Ambulatory Visit: Payer: Self-pay | Admitting: Family Medicine

## 2013-06-09 DIAGNOSIS — M19019 Primary osteoarthritis, unspecified shoulder: Secondary | ICD-10-CM | POA: Insufficient documentation

## 2013-06-09 MED ORDER — ESCITALOPRAM OXALATE 20 MG PO TABS: 20.0000 mg | ORAL_TABLET | Freq: Every day | ORAL | Status: DC

## 2013-06-23 ENCOUNTER — Other Ambulatory Visit: Payer: Self-pay | Admitting: *Deleted

## 2013-06-23 MED ORDER — LORAZEPAM 1 MG PO TABS: ORAL_TABLET | ORAL | Status: DC

## 2013-06-23 NOTE — Telephone Encounter (Signed)
Phoned in to [facility].

## 2013-07-17 ENCOUNTER — Other Ambulatory Visit: Payer: Self-pay | Admitting: *Deleted

## 2013-07-17 MED ORDER — LORAZEPAM 1 MG PO TABS: ORAL_TABLET | ORAL | Status: DC

## 2013-07-17 NOTE — Telephone Encounter (Signed)
Refill called to [facility] pharmacy.

## 2013-07-17 NOTE — Telephone Encounter (Signed)
Last filled 06/23/13 

## 2013-08-10 ENCOUNTER — Other Ambulatory Visit: Payer: Self-pay | Admitting: *Deleted

## 2013-08-10 MED ORDER — LORAZEPAM 1 MG PO TABS: ORAL_TABLET | ORAL | Status: DC

## 2013-08-10 NOTE — Telephone Encounter (Signed)
Faxed refill request for lorazepam from [facility], last filled 90 on 07/17/13.

## 2013-08-10 NOTE — Telephone Encounter (Signed)
plz phone in. 

## 2013-08-11 NOTE — Telephone Encounter (Signed)
Refill called to [facility].

## 2013-09-03 ENCOUNTER — Other Ambulatory Visit: Payer: Self-pay | Admitting: *Deleted

## 2013-09-03 MED ORDER — LORAZEPAM 1 MG PO TABS: ORAL_TABLET | ORAL | Status: DC

## 2013-09-03 NOTE — Telephone Encounter (Signed)
Refill called to [facility]. 

## 2013-09-03 NOTE — Telephone Encounter (Signed)
Last filled 08/11/13

## 2013-09-23 ENCOUNTER — Other Ambulatory Visit: Payer: Self-pay | Admitting: *Deleted

## 2013-09-23 NOTE — Telephone Encounter (Signed)
Last filled 09/04/13

## 2013-09-24 ENCOUNTER — Other Ambulatory Visit: Payer: Self-pay | Admitting: *Deleted

## 2013-09-24 NOTE — Telephone Encounter (Signed)
Last refill 9/4. Requesting refill 10 days early.   Given below note at last OV in 12/2012 with Dr. Dayton Martes I am refusing early refill, Dr. Dayton Martes will be back TOMORROW. Anxiety state, unspecified  Deteriorated. 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 He repeatedly asked me to increase dose of Ativan but I explained to him why I will not be increasing the dose of his Ativan. We will increase his dose of Lexapro today and continue current dose of Ativan. The patient indicates understanding of these issues and agrees with the plan.

## 2013-09-24 NOTE — Telephone Encounter (Signed)
Request early refill, going out of town on business for 2 weeks.

## 2013-09-25 ENCOUNTER — Other Ambulatory Visit: Payer: Self-pay | Admitting: *Deleted

## 2013-09-25 MED ORDER — LORAZEPAM 1 MG PO TABS: ORAL_TABLET | ORAL | Status: DC

## 2013-09-25 NOTE — Telephone Encounter (Signed)
Ok to refill early ONE time only.  We will not be able to do this again in the future.

## 2013-09-25 NOTE — Telephone Encounter (Signed)
Ok to refill? Patient is going out of town x 2 weeks. Last filled 09/03/13.

## 2013-09-25 NOTE — Telephone Encounter (Signed)
Rx called in as directed with message this would be only early refill.

## 2013-10-19 ENCOUNTER — Other Ambulatory Visit: Payer: Self-pay

## 2013-10-19 MED ORDER — LISINOPRIL-HYDROCHLOROTHIAZIDE 20-12.5 MG PO TABS: ORAL_TABLET | ORAL | Status: DC

## 2013-10-19 NOTE — Telephone Encounter (Signed)
Pt said Dr Dayton Martes had said if needed to take more than 3 lorazepam daily if needed due to stress pt could take more. Pt request refill lorazepam to [facility]. Pt has 2 pills left. Pt has been stressed due to preparation of having shoulder surgery. Pt request cb when med called in.

## 2013-10-20 MED ORDER — LORAZEPAM 1 MG PO TABS: ORAL_TABLET | ORAL | Status: DC

## 2013-10-20 NOTE — Telephone Encounter (Signed)
Per dr Dayton Martes note on 01/13/13, this does not appear to be the case. He can have it refilled on 10/25/2013, ok for you to call that in.

## 2013-10-20 NOTE — Telephone Encounter (Signed)
All I can tell him is that in her noted they discussed benzo dependency and that she was not going to increase the dose or amount given and proper instructions for the ativan. I'm sorry that he is taking more than prescribed but no refill from me until 10/25/13. He may go to urgent care or ER if needed

## 2013-10-20 NOTE — Telephone Encounter (Signed)
Pt called to ck on status uf Lorazepam refill. Pt advised as instructed. Medication phoned to [facility] pharmacy as instructed. Pt said he is taking his last Lorazepam this morning. Pt wants to know what he is to do for the next 4 days without Lorazepam. Reviewed with pt the instructions for Lorazepam. Pt said he understands the instructions but was told by Dr Dayton Martes could increase med if extremely stressed. Pt request cb.

## 2013-10-20 NOTE — Telephone Encounter (Signed)
Patient advised.

## 2013-11-09 ENCOUNTER — Other Ambulatory Visit: Payer: Self-pay | Admitting: *Deleted

## 2013-11-09 MED ORDER — ESCITALOPRAM OXALATE 20 MG PO TABS: 20.0000 mg | ORAL_TABLET | Freq: Every day | ORAL | Status: DC

## 2013-11-09 NOTE — Telephone Encounter (Signed)
Last office visit 05/07/2013.  Ok to refill?

## 2013-11-23 ENCOUNTER — Other Ambulatory Visit: Payer: Self-pay | Admitting: Family Medicine

## 2013-11-23 MED ORDER — LORAZEPAM 1 MG PO TABS: ORAL_TABLET | ORAL | Status: DC

## 2013-11-23 NOTE — Telephone Encounter (Signed)
Phoned in to pharmacy. 

## 2013-11-23 NOTE — Telephone Encounter (Signed)
Ok to phone in.

## 2013-12-01 ENCOUNTER — Other Ambulatory Visit: Payer: Self-pay | Admitting: *Deleted

## 2013-12-01 MED ORDER — FLUTICASONE PROPIONATE 50 MCG/ACT NA SUSP: 2.0000 | Freq: Every day | NASAL | Status: DC

## 2013-12-21 ENCOUNTER — Other Ambulatory Visit: Payer: Self-pay

## 2013-12-21 ENCOUNTER — Other Ambulatory Visit: Payer: Self-pay | Admitting: Family Medicine

## 2013-12-21 NOTE — Telephone Encounter (Signed)
Spoke to [place] at pharmacy;Rx called in as requested

## 2013-12-21 NOTE — Telephone Encounter (Signed)
Pt requesting medication refill. Last OV 05/27/2013, with no future appts scheduled. Pls advise

## 2013-12-21 NOTE — Telephone Encounter (Signed)
Ok to phone in ativan 

## 2014-01-22 ENCOUNTER — Other Ambulatory Visit: Payer: Self-pay | Admitting: Family Medicine

## 2014-01-22 NOTE — Telephone Encounter (Signed)
Lm on pts vm informing him Rx has been faxed to requested pharmacy 

## 2014-02-04 ENCOUNTER — Ambulatory Visit (INDEPENDENT_AMBULATORY_CARE_PROVIDER_SITE_OTHER): Payer: Managed Care, Other (non HMO) | Admitting: Family Medicine

## 2014-02-08 ENCOUNTER — Ambulatory Visit: Payer: Managed Care, Other (non HMO) | Admitting: Family Medicine

## 2014-02-09 ENCOUNTER — Ambulatory Visit (INDEPENDENT_AMBULATORY_CARE_PROVIDER_SITE_OTHER): Payer: Managed Care, Other (non HMO) | Admitting: Family Medicine

## 2014-02-09 ENCOUNTER — Encounter: Payer: Self-pay | Admitting: Family Medicine

## 2014-02-09 VITALS — BP 124/74 | HR 103 | Temp 98.0°F | Ht 69.0 in | Wt 197.8 lb

## 2014-02-09 DIAGNOSIS — J069 Acute upper respiratory infection, unspecified: Secondary | ICD-10-CM | POA: Insufficient documentation

## 2014-02-09 DIAGNOSIS — I10 Essential (primary) hypertension: Secondary | ICD-10-CM

## 2014-02-09 DIAGNOSIS — F411 Generalized anxiety disorder: Secondary | ICD-10-CM

## 2014-02-09 MED ORDER — AZITHROMYCIN 250 MG PO TABS: ORAL_TABLET | ORAL | Status: DC

## 2014-02-09 MED ORDER — LORAZEPAM 1 MG PO TABS: ORAL_TABLET | ORAL | Status: DC

## 2014-02-09 NOTE — Assessment & Plan Note (Signed)
Stable No changes 

## 2014-02-09 NOTE — Patient Instructions (Signed)
Great to see you. I think you likely have a virus. Keep doing what you're doing.  Ok to fill your zpack if your symptoms worsen.  I am so proud of you.

## 2014-02-09 NOTE — Assessment & Plan Note (Signed)
Likely viral. Advised supportive care. Rx given for zpack to fill if symptoms progress (h/o recurrent sinusitis).

## 2014-02-09 NOTE — Progress Notes (Signed)
Pre-visit discussion using our clinic review tool. No additional management support is needed unless otherwise documented below in the visit note.  

## 2014-02-09 NOTE — Progress Notes (Signed)
63 yo pleasant male here to discuss his anxiety and BP meds.  HTN- BPs had been stable on Lisinopril 20 and HCTZ 12.5 mg daily since he quit drinking alcohol.  Feels great.  Anxiety- better controlled on Lexapro.  Has not needed the ativan as frequently.   URI- 1 week of congestion, runny nose.  No fevers.  Some sinus pressure. No SOB or CP. Patient Active Problem List   Diagnosis Date Noted  . Left shoulder pain 05/27/2013  . ANXIETY STATE, UNSPECIFIED 01/22/2011  . MUSCLE SPASM, TRAPEZIUS 01/04/2011  . FATIGUE 01/04/2011  . POLYCYTHEMIA 01/04/2011  . HYPERLIPIDEMIA 11/28/2010  . ESSENTIAL HYPERTENSION, BENIGN 11/28/2010  . HERNIA, VENTRAL 02/25/2009  . COLONIC POLYPS, BENIGN, HX OF 02/25/2009   No past medical history on file. No past surgical history on file. History  Substance Use Topics  . Smoking status: Never Smoker   . Smokeless tobacco: Not on file  . Alcohol Use: Not on file   No family history on file. Allergies  Allergen Reactions  . Penicillins    Current Outpatient Prescriptions on File Prior to Visit  Medication Sig Dispense Refill  . acetaminophen (TYLENOL) 500 MG tablet Take 500 mg by mouth every 6 (six) hours as needed.        Marland Kitchen escitalopram (LEXAPRO) 20 MG tablet Take 1 tablet (20 mg total) by mouth daily.  30 tablet  4  . fexofenadine (ALLEGRA) 180 MG tablet Take 180 mg by mouth daily.      . fluticasone (FLONASE) 50 MCG/ACT nasal spray Place 2 sprays into both nostrils daily. Please schedule an appointment to have a physical in Febuary 2015.  16 g  3  . lisinopril-hydrochlorothiazide (PRINZIDE,ZESTORETIC) 20-12.5 MG per tablet TAKE ONE TABLET EVERY MORNING  30 tablet  5   No current facility-administered medications on file prior to visit.    The PMH, PSH, Social History, Family History, Medications, and allergies have been reviewed in [facility], and have been updated if relevant.   Physical Exam BP 124/74  Pulse 103  Temp(Src) 98 F (36.7 C) (Oral)   Ht 5\' 9"  (1.753 m)  Wt 197 lb 12 oz (89.699 kg)  BMI 29.19 kg/m2  SpO2 97%  Wt Readings from Last 3 Encounters:  02/09/14 197 lb 12 oz (89.699 kg)  05/27/13 192 lb (87.091 kg)  01/13/13 194 lb (87.998 kg)    General:  alert, well-developed, well-nourished, and well-hydrated.   Mouth:  good dentition.  Nose:  + erythema mucosal, sinuses NTTP +PND  Lungs:  Normal respiratory effort, chest expands symmetrically. Lungs are clear to auscultation, no crackles or wheezes. Heart:  Normal rate and regular rhythm. S1 and S2 normal without gallop, murmur, click, rub or other extra sounds. Extremities:  No clubbing, cyanosis, edema, or deformity noted with normal full range of motion of all joints.   Neurologic:  alert & oriented X3.   Psych:  Oriented X3, good eye contact, not anxious appearing, and not depressed appearing.    Assessment and Plan:

## 2014-02-09 NOTE — Assessment & Plan Note (Signed)
Stable.  NO changes. 

## 2014-02-10 ENCOUNTER — Telehealth: Payer: Self-pay | Admitting: Family Medicine

## 2014-02-10 NOTE — Telephone Encounter (Signed)
Relevant patient education mailed to patient.  

## 2014-02-22 ENCOUNTER — Other Ambulatory Visit: Payer: Self-pay | Admitting: Family Medicine

## 2014-03-23 ENCOUNTER — Other Ambulatory Visit: Payer: Self-pay | Admitting: *Deleted

## 2014-03-23 MED ORDER — LORAZEPAM 1 MG PO TABS: ORAL_TABLET | ORAL | Status: DC

## 2014-03-23 NOTE — Telephone Encounter (Signed)
Pt requesting medication refill. Last ov 01/2014 with no future appts scheduled. pls advise 

## 2014-03-24 NOTE — Telephone Encounter (Signed)
Spoke to pt and informed him Rx has been called in to requested pharmacy 

## 2014-04-07 ENCOUNTER — Other Ambulatory Visit: Payer: Self-pay | Admitting: Internal Medicine

## 2014-04-08 ENCOUNTER — Telehealth: Payer: Self-pay | Admitting: Family Medicine

## 2014-04-08 NOTE — Telephone Encounter (Signed)
Dr. Deborra Medina out of office.  Routed to Webb Silversmith, NP as Juluis Rainier.

## 2014-04-08 NOTE — Telephone Encounter (Signed)
Patient Information:  Caller Name: Brad Cummings  Phone: [phone]  Patient: Brad, Cummings  Gender: Male  DOB: 1951/02/13  Age: 63 Years  PCP: Brad Cummings [facility])  Office Follow Up:  Does the office need to follow up with this patient?: Yes  Instructions For The Office: Please follow up regarding possible appointment.  He refused to check for another locaiton and requests appointment for 04/09/14.   Symptoms  Reason For Call & Symptoms: Scraped  left medial forearm arm against deck estimated 03/18/14; has been applying Neosporin and now has a red, itchy rash.  Scrape at wrist and 4" below the elbow 3" long.   Red splotches  surround the scrapes.  Caller states, "It looks like it's infected."   With infection starting on or about 04/05/14 after he tried Polysporin per recommendation of RPh.  He states, It bubbles up and is ugly.". Emergent symptoms ruled out.  Go to [facility] per Skin Injury guideline due to Looks infected.  Reviewed Health History In EMR: Yes  Reviewed Medications In EMR: Yes  Reviewed Allergies In EMR: Yes  Reviewed Surgeries / Procedures: Yes  Date of Onset of Symptoms: 03/18/2014  Treatments Tried: Neosporin - got worse  Treatments Tried Worked: No  Guideline(s) Used:  Skin Injury  Disposition Per Guideline:   Go to [facility]  Reason For Disposition Reached:   Looks infected (fever, spreading redness, pus, or red streak)  Advice Given:  Pain Medicines:  For pain relief, you can take either acetaminophen, ibuprofen, or naproxen.  Call Back If:  You become worse.  Patient Refused Recommendation:  Patient Refused Appt, Patient Requests Appt At Later Date  Patient requests appointment for 04/09/14; he declined offer of appointment at another location. 04/08/14.

## 2014-04-08 NOTE — Telephone Encounter (Signed)
OK for him to be seen in am

## 2014-04-08 NOTE — Telephone Encounter (Signed)
Spoke to pt who states that he has made an appt with a dermatologist at 0930 on 04/09/14 and will not to be seen at this office

## 2014-04-21 ENCOUNTER — Other Ambulatory Visit: Payer: Self-pay

## 2014-04-21 MED ORDER — LORAZEPAM 1 MG PO TABS: ORAL_TABLET | ORAL | Status: DC

## 2014-04-21 NOTE — Telephone Encounter (Signed)
Spoke to pt and informed him Rx has been called in to requested pharmacy 

## 2014-04-21 NOTE — Telephone Encounter (Signed)
Last prescribed 03/23/14 and last OV 02/09/14--please advise

## 2014-04-23 ENCOUNTER — Other Ambulatory Visit: Payer: Self-pay | Admitting: Family Medicine

## 2014-04-26 ENCOUNTER — Telehealth: Payer: Self-pay | Admitting: *Deleted

## 2014-04-26 NOTE — Telephone Encounter (Signed)
Received a fax from [facility] that pt is requesting automatic refills of Lorazepam instead of having to request refills monthy. Per pharmacy next fill dates would be 05/21/14, 06/20/14, 07/20/14, 08/19/14, and 09/18/14. Pt's last refill was 04/21/14. Is this ok, or should pharmacy continue to request refills monthly?

## 2014-04-26 NOTE — Telephone Encounter (Signed)
Ok to do this but for no more than 3 months.

## 2014-04-26 NOTE — Telephone Encounter (Signed)
Spoke to pt  And pharmacy who states that he was not wanting automatic refill. This was for one time only

## 2014-05-26 ENCOUNTER — Other Ambulatory Visit: Payer: Self-pay | Admitting: *Deleted

## 2014-05-26 MED ORDER — LORAZEPAM 1 MG PO TABS: ORAL_TABLET | ORAL | Status: DC

## 2014-05-26 NOTE — Telephone Encounter (Signed)
Pt requesting medication refill. Last ov 01/2014-acute with no future appts scheduled. pls advise

## 2014-05-27 NOTE — Telephone Encounter (Signed)
Spoke to pt and informed him Rx has been called in to requested pharmacy 

## 2014-05-28 ENCOUNTER — Other Ambulatory Visit: Payer: Self-pay | Admitting: *Deleted

## 2014-06-02 ENCOUNTER — Telehealth: Payer: Self-pay | Admitting: *Deleted

## 2014-06-02 NOTE — Telephone Encounter (Signed)
Received fax from [facility] for a 90 day rx of  Escitalopram. Pt has not been seen for a med f/u since Jan 2014. I left message on cell stating only for pt to return my call to the office. Once he returns call he needs to be scheduled for a cpx with fasting labs prior within the next 3 months for a refill.

## 2014-06-04 MED ORDER — ESCITALOPRAM OXALATE 20 MG PO TABS: ORAL_TABLET | ORAL | Status: DC

## 2014-06-04 NOTE — Telephone Encounter (Signed)
Pt returned call to office, and left message for me to return his call. I left message on cell that rx was sent in, but that he also needed to schedule a cpx within 3 months.

## 2014-06-23 ENCOUNTER — Other Ambulatory Visit: Payer: Self-pay | Admitting: *Deleted

## 2014-06-23 MED ORDER — LORAZEPAM 1 MG PO TABS: ORAL_TABLET | ORAL | Status: DC

## 2014-06-23 NOTE — Telephone Encounter (Signed)
Pt requesting medication refill. Last 01/2014 with no future appt scheduled. pls advise

## 2014-06-24 ENCOUNTER — Other Ambulatory Visit: Payer: Self-pay | Admitting: Family Medicine

## 2014-06-24 MED ORDER — LORAZEPAM 1 MG PO TABS: ORAL_TABLET | ORAL | Status: DC

## 2014-06-24 NOTE — Addendum Note (Signed)
Addended by: Modena Nunnery on: 06/24/2014 02:26 PM   Modules accepted: Orders

## 2014-06-25 NOTE — Telephone Encounter (Signed)
Spoke to pt and informed him Rx has been called in to requested pharmacy 

## 2014-07-23 ENCOUNTER — Other Ambulatory Visit: Payer: Self-pay | Admitting: *Deleted

## 2014-07-23 MED ORDER — LORAZEPAM 1 MG PO TABS: ORAL_TABLET | ORAL | Status: DC

## 2014-07-23 NOTE — Telephone Encounter (Signed)
Last filled 06/25/14

## 2014-07-23 NOTE — Telephone Encounter (Signed)
rx called in to requested pharmacy 

## 2014-08-16 ENCOUNTER — Ambulatory Visit (INDEPENDENT_AMBULATORY_CARE_PROVIDER_SITE_OTHER): Payer: Managed Care, Other (non HMO) | Admitting: Family Medicine

## 2014-08-16 ENCOUNTER — Encounter: Payer: Self-pay | Admitting: Family Medicine

## 2014-08-16 VITALS — BP 122/70 | HR 94 | Temp 98.1°F | Ht 69.5 in | Wt 191.0 lb

## 2014-08-16 DIAGNOSIS — Z1211 Encounter for screening for malignant neoplasm of colon: Secondary | ICD-10-CM

## 2014-08-16 DIAGNOSIS — I10 Essential (primary) hypertension: Secondary | ICD-10-CM

## 2014-08-16 DIAGNOSIS — Z0000 Encounter for general adult medical examination without abnormal findings: Secondary | ICD-10-CM

## 2014-08-16 DIAGNOSIS — F411 Generalized anxiety disorder: Secondary | ICD-10-CM

## 2014-08-16 DIAGNOSIS — Z125 Encounter for screening for malignant neoplasm of prostate: Secondary | ICD-10-CM

## 2014-08-16 DIAGNOSIS — R7989 Other specified abnormal findings of blood chemistry: Secondary | ICD-10-CM

## 2014-08-16 DIAGNOSIS — E785 Hyperlipidemia, unspecified: Secondary | ICD-10-CM

## 2014-08-16 LAB — PSA: PSA: 5.48 ng/mL — ABNORMAL HIGH (ref 0.10–4.00)

## 2014-08-16 MED ORDER — LISINOPRIL-HYDROCHLOROTHIAZIDE 20-12.5 MG PO TABS: ORAL_TABLET | ORAL | Status: DC

## 2014-08-16 NOTE — Assessment & Plan Note (Signed)
Reviewed preventive care protocols, scheduled due services, and updated immunizations Discussed nutrition, exercise, diet, and healthy lifestyle.  Orders Placed This Encounter  Procedures  . Fecal occult blood, imunochemical  . Comprehensive metabolic panel  . Lipid panel  . PSA

## 2014-08-16 NOTE — Addendum Note (Signed)
Addended by: Ellamae Sia on: 08/16/2014 12:27 PM   Modules accepted: Orders

## 2014-08-16 NOTE — Patient Instructions (Signed)
I will call you with your lab results. It was great to see you. 

## 2014-08-16 NOTE — Assessment & Plan Note (Signed)
Due for labs today. 

## 2014-08-16 NOTE — Progress Notes (Signed)
63 yo pleasant male here for CPX.  HTN- BPs had been stable on Lisinopril 20 and HCTZ 12.5 mg daily since he quit drinking alcohol.  Feels great.  Anxiety- better controlled on Lexapro.  Has not needed the ativan as frequently. Started taking 1/2 tablet instead of a full tablet. Denies any panic attacks or symptoms of anxiety or depression  Zostavax 05/05/11 Colonoscopy 08/13/07 at [facility].  Developed a staph infection- refuses to have another colonoscopy. Denies any changes in his bowel habits or blood in his stool.   Lab Results  Component Value Date   CHOL 180 01/13/2013   HDL 43.90 01/13/2013   LDLCALC  Value: 148        Total Cholesterol/HDL:CHD Risk Coronary Heart Disease Risk Table                     Men   Women  1/2 Average Risk   3.4   3.3  Average Risk       5.0   4.4  2 X Average Risk   9.6   7.1  3 X Average Risk  23.4   11.0        Use the calculated Patient Ratio above and the CHD Risk Table to determine the patient's CHD Risk.        ATP III CLASSIFICATION (LDL):  <100     mg/dL   Optimal  100-129  mg/dL   Near or Above                    Optimal  130-159  mg/dL   Borderline  160-189  mg/dL   High  >190     mg/dL   Very High* 01/17/2011   LDLDIRECT 84.0 01/13/2013   TRIG 359.0* 01/13/2013   CHOLHDL 4 01/13/2013   Lab Results  Component Value Date   CREATININE 1.3 01/13/2013   No results found for this basename: PSA    Patient Active Problem List   Diagnosis Date Noted  . Routine general medical examination at a health care facility 08/16/2014  . Left shoulder pain 05/27/2013  . ANXIETY STATE, UNSPECIFIED 01/22/2011  . MUSCLE SPASM, TRAPEZIUS 01/04/2011  . FATIGUE 01/04/2011  . POLYCYTHEMIA 01/04/2011  . HYPERLIPIDEMIA 11/28/2010  . ESSENTIAL HYPERTENSION, BENIGN 11/28/2010  . HERNIA, VENTRAL 02/25/2009  . COLONIC POLYPS, BENIGN, HX OF 02/25/2009   No past medical history on file. No past surgical history on file. History  Substance Use Topics  . Smoking status: Never  Smoker   . Smokeless tobacco: Not on file  . Alcohol Use: Not on file   No family history on file. Allergies  Allergen Reactions  . Penicillins    Current Outpatient Prescriptions on File Prior to Visit  Medication Sig Dispense Refill  . acetaminophen (TYLENOL) 500 MG tablet Take 500 mg by mouth every 6 (six) hours as needed.        Marland Kitchen escitalopram (LEXAPRO) 20 MG tablet TAKE 1 TABLET EVERY DAY  90 tablet  0  . fluticasone (FLONASE) 50 MCG/ACT nasal spray USE 2 SPRAYS IN EACH NOSTRIL EVERY DAY  16 g  2  . LORazepam (ATIVAN) 1 MG tablet TAKE ONE TABLET THREE TIMES A DAY IF NEEDED FOR ANXIETY  90 tablet  0   No current facility-administered medications on file prior to visit.    The PMH, PSH, Social History, Family History, Medications, and allergies have been reviewed in [facility], and have been updated if relevant.  Physical Exam BP 122/70  Pulse 94  Temp(Src) 98.1 F (36.7 C) (Oral)  Ht 5' 9.5" (1.765 m)  Wt 191 lb (86.637 kg)  BMI 27.81 kg/m2  SpO2 96%  Wt Readings from Last 3 Encounters:  08/16/14 191 lb (86.637 kg)  02/09/14 197 lb 12 oz (89.699 kg)  05/27/13 192 lb (87.091 kg)    General:  pleasant male in NAD Eyes:  PERRL Ears:  External ear exam shows no significant lesions or deformities.  Otoscopic examination reveals clear canals, tympanic membranes are intact bilaterally without bulging, retraction, inflammation or discharge. Hearing is grossly normal bilaterally. Nose:  External nasal examination shows no deformity or inflammation. Nasal mucosa are pink and moist without lesions or exudates. Mouth:  Oral mucosa and oropharynx without lesions or exudates.  Teeth in good repair. Neck:  no carotid bruit or thyromegaly no cervical or supraclavicular lymphadenopathy  Lungs:  Normal respiratory effort, chest expands symmetrically. Lungs are clear to auscultation, no crackles or wheezes. Heart:  Normal rate and regular rhythm. S1 and S2 normal without gallop, murmur,  click, rub or other extra sounds. Abdomen:  Bowel sounds positive,abdomen soft and non-tender without masses, organomegaly or hernias noted. Pulses:  R and L posterior tibial pulses are full and equal bilaterally  Extremities:  no edema    Assessment and Plan:

## 2014-08-16 NOTE — Assessment & Plan Note (Signed)
Good control with lexapro 20 mg daily and prn lorezepam.

## 2014-08-16 NOTE — Assessment & Plan Note (Signed)
Well controlled on current dose of Prinzide. No changes.

## 2014-08-17 ENCOUNTER — Other Ambulatory Visit: Payer: Self-pay | Admitting: Family Medicine

## 2014-08-17 LAB — LIPID PANEL
CHOLESTEROL: 260 mg/dL — AB (ref 0–200)
HDL: 57 mg/dL (ref >39.00)
NonHDL: 203
Total CHOL/HDL Ratio: 5
VLDL: 123.8 mg/dL — AB (ref 0.0–40.0)

## 2014-08-17 LAB — COMPREHENSIVE METABOLIC PANEL
ALK PHOS: 44 U/L (ref 39–117)
ALT: 32 U/L (ref 0–53)
AST: 34 U/L (ref 0–37)
Albumin: 4.4 g/dL (ref 3.5–5.2)
BILIRUBIN TOTAL: 0.6 mg/dL (ref 0.2–1.2)
BUN: 13 mg/dL (ref 6–23)
CO2: 20 meq/L (ref 19–32)
CREATININE: 1.1 mg/dL (ref 0.4–1.5)
Calcium: 9.3 mg/dL (ref 8.4–10.5)
Chloride: 98 mEq/L (ref 96–112)
GFR: 73.74 mL/min (ref >60.00)
GLUCOSE: 128 mg/dL — AB (ref 70–99)
Potassium: 4.2 mEq/L (ref 3.5–5.1)
Sodium: 137 mEq/L (ref 135–145)
Total Protein: 7.2 g/dL (ref 6.0–8.3)

## 2014-08-17 LAB — LDL CHOLESTEROL, DIRECT: LDL DIRECT: 165 mg/dL

## 2014-08-17 MED ORDER — ATORVASTATIN CALCIUM 40 MG PO TABS: ORAL_TABLET | ORAL | Status: DC

## 2014-08-17 NOTE — Addendum Note (Signed)
Addended by: Modena Nunnery on: 08/17/2014 02:25 PM   Modules accepted: Orders

## 2014-08-24 ENCOUNTER — Other Ambulatory Visit: Payer: Self-pay | Admitting: Family Medicine

## 2014-08-24 NOTE — Telephone Encounter (Signed)
Rx faxed to requested pharmacy 

## 2014-08-24 NOTE — Telephone Encounter (Signed)
Pt requesting medication refill. Last f/u appt 07/2014. Ok to fill per Dr Deborra Medina

## 2014-08-25 ENCOUNTER — Other Ambulatory Visit: Payer: Self-pay

## 2014-08-25 MED ORDER — ESCITALOPRAM OXALATE 20 MG PO TABS: ORAL_TABLET | ORAL | Status: DC

## 2014-08-25 NOTE — Telephone Encounter (Signed)
Last filled 06/04/2014 #90--pt last OV CPE was 08/16/2014--please advise

## 2014-09-14 ENCOUNTER — Encounter: Payer: Self-pay | Admitting: Radiology

## 2014-09-14 ENCOUNTER — Other Ambulatory Visit (INDEPENDENT_AMBULATORY_CARE_PROVIDER_SITE_OTHER): Payer: Managed Care, Other (non HMO)

## 2014-09-14 DIAGNOSIS — E785 Hyperlipidemia, unspecified: Secondary | ICD-10-CM

## 2014-09-14 LAB — LDL CHOLESTEROL, DIRECT: LDL DIRECT: 84.3 mg/dL

## 2014-09-14 LAB — LIPID PANEL
Cholesterol: 165 mg/dL (ref 0–200)
HDL: 54.2 mg/dL (ref >39.00)
NONHDL: 110.8
TRIGLYCERIDES: 249 mg/dL — AB (ref 0.0–149.0)
Total CHOL/HDL Ratio: 3
VLDL: 49.8 mg/dL — ABNORMAL HIGH (ref 0.0–40.0)

## 2014-09-15 ENCOUNTER — Encounter: Payer: Self-pay | Admitting: *Deleted

## 2014-09-16 ENCOUNTER — Ambulatory Visit (INDEPENDENT_AMBULATORY_CARE_PROVIDER_SITE_OTHER): Payer: Managed Care, Other (non HMO) | Admitting: Family Medicine

## 2014-09-16 ENCOUNTER — Encounter: Payer: Self-pay | Admitting: Family Medicine

## 2014-09-16 VITALS — BP 122/78 | HR 90 | Temp 98.1°F | Wt 189.0 lb

## 2014-09-16 DIAGNOSIS — R197 Diarrhea, unspecified: Secondary | ICD-10-CM | POA: Insufficient documentation

## 2014-09-16 DIAGNOSIS — Z23 Encounter for immunization: Secondary | ICD-10-CM

## 2014-09-16 NOTE — Addendum Note (Signed)
Addended by: Modena Nunnery on: 09/16/2014 01:00 PM   Modules accepted: Orders

## 2014-09-16 NOTE — Progress Notes (Signed)
Pre visit review using our clinic review tool, if applicable. No additional management support is needed unless otherwise documented below in the visit note. 

## 2014-09-16 NOTE — Patient Instructions (Signed)
Viral Gastroenteritis Viral gastroenteritis is also known as stomach flu. This condition affects the stomach and intestinal tract. It can cause sudden diarrhea and vomiting. The illness typically lasts 3 to 8 days. Most people develop an immune response that eventually gets rid of the virus. While this natural response develops, the virus can make you quite ill. CAUSES  Many different viruses can cause gastroenteritis, such as rotavirus or noroviruses. You can catch one of these viruses by consuming contaminated food or water. You may also catch a virus by sharing utensils or other personal items with an infected person or by touching a contaminated surface. SYMPTOMS  The most common symptoms are diarrhea and vomiting. These problems can cause a severe loss of body fluids (dehydration) and a body salt (electrolyte) imbalance. Other symptoms may include:   Headache.  Fatigue.  Abdominal pain. DIAGNOSIS  Your caregiver can usually diagnose viral gastroenteritis based on your symptoms and a physical exam. A stool sample may also be taken to test for the presence of viruses or other infections. TREATMENT  This illness typically goes away on its own. Treatments are aimed at rehydration. The most serious cases of viral gastroenteritis involve vomiting so severely that you are not able to keep fluids down. In these cases, fluids must be given through an intravenous line (IV). HOME CARE INSTRUCTIONS   Drink enough fluids to keep your urine clear or pale yellow. Drink small amounts of fluids frequently and increase the amounts as tolerated.  Ask your caregiver for specific rehydration instructions.  Avoid:  Foods high in sugar.  Alcohol.  Carbonated drinks.  Tobacco.  Juice.  Caffeine drinks.  Extremely hot or cold fluids.  Fatty, greasy foods.  Too much intake of anything at one time.  Dairy products until 24 to 48 hours after diarrhea stops.  You may consume probiotics.  Probiotics are active cultures of beneficial bacteria. They may lessen the amount and number of diarrheal stools in adults. Probiotics can be found in yogurt with active cultures and in supplements.  Wash your hands well to avoid spreading the virus.  Only take over-the-counter or prescription medicines for pain, discomfort, or fever as directed by your caregiver. Do not give aspirin to children. Antidiarrheal medicines are not recommended.  Ask your caregiver if you should continue to take your regular prescribed and over-the-counter medicines.  Keep all follow-up appointments as directed by your caregiver. SEEK IMMEDIATE MEDICAL CARE IF:   You are unable to keep fluids down.  You do not urinate at least once every 6 to 8 hours.  You develop shortness of breath.  You notice blood in your stool or vomit. This may look like coffee grounds.  You have abdominal pain that increases or is concentrated in one small area (localized).  You have persistent vomiting or diarrhea.  You have a fever.  The patient is a child younger than 3 months, and he or she has a fever.  The patient is a child older than 3 months, and he or she has a fever and persistent symptoms.  The patient is a child older than 3 months, and he or she has a fever and symptoms suddenly get worse.  The patient is a baby, and he or she has no tears when crying. MAKE SURE YOU:   Understand these instructions.  Will watch your condition.  Will get help right away if you are not doing well or get worse. Document Released: 12/17/2005 Document Revised: 03/10/2012 Document Reviewed: 10/03/2011 [employer]  Patient Information 2015 [employer]. This information is not intended to replace advice given to you by your health care provider. Make sure you discuss any questions you have with your health care provider.  

## 2014-09-16 NOTE — Progress Notes (Signed)
(  S) Brad Cummings is a 63 y.o. male with complaint of gastrointestinal symptoms of watery diarrhea, abdominal pain,headache, lower abdominal cramps for 8 days. No blood in stool.  Daughter had the stomach bug last week.  Current Outpatient Prescriptions on File Prior to Visit  Medication Sig Dispense Refill  . acetaminophen (TYLENOL) 500 MG tablet Take 500 mg by mouth every 6 (six) hours as needed.        Marland Kitchen escitalopram (LEXAPRO) 20 MG tablet TAKE 1 TABLET EVERY DAY  90 tablet  0  . fluticasone (FLONASE) 50 MCG/ACT nasal spray USE 2 SPRAYS IN EACH NOSTRIL EVERY DAY  16 g  2  . lisinopril-hydrochlorothiazide (PRINZIDE,ZESTORETIC) 20-12.5 MG per tablet TAKE ONE TABLET EVERY MORNING  30 tablet  5  . LORazepam (ATIVAN) 1 MG tablet TAKE ONE TABLET THREE TIMES A DAY IF NEEDED FOR ANXIETY  90 tablet  1  . atorvastatin (LIPITOR) 40 MG tablet TAKE 1 TABLET EVERY DAY  30 tablet  3   No current facility-administered medications on file prior to visit.    Allergies  Allergen Reactions  . Penicillins     No past medical history on file.  No past surgical history on file.  No family history on file.  History   Social History  . Marital Status: Married    Spouse Name: N/A    Number of Children: 1  . Years of Education: N/A   Occupational History  . 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 Smoking status: Never Smoker   . Smokeless tobacco: Not on file  . Alcohol Use: Not on file  . Drug Use: Not on file  . Sexual Activity: Not on file   Other Topics Concern  . Not on file   Social History Narrative  . No narrative on file   The PMH, PSH, Social History, Family History, Medications, and allergies have been reviewed in [facility], and have been updated if relevant.   (O) BP 122/78  Pulse 90  Temp(Src) 98.1 F (36.7 C) (Oral)  Wt 189 lb (85.73 kg)  SpO2 96%   Physical exam reveals the patient appears well. Hydration status: well hydrated. Abdomen: abdomen is  soft without significant tenderness, masses, organomegaly or guarding..  (A) Viral Gastroenteritis  (P) I have recommended small amounts clear fluids frequently, soups, juices, water and advance diet as tolerated. Return office visit if symptoms persist or worsen; I have alerted the patient to call if high fever, dehydration, marked weakness, fainting, increased abdominal pain, blood in stool or vomit.

## 2014-09-17 ENCOUNTER — Other Ambulatory Visit (INDEPENDENT_AMBULATORY_CARE_PROVIDER_SITE_OTHER): Payer: Managed Care, Other (non HMO)

## 2014-09-17 DIAGNOSIS — Z1211 Encounter for screening for malignant neoplasm of colon: Secondary | ICD-10-CM

## 2014-09-17 LAB — FECAL OCCULT BLOOD, IMMUNOCHEMICAL: Fecal Occult Bld: POSITIVE — AB

## 2014-09-22 DIAGNOSIS — M5412 Radiculopathy, cervical region: Secondary | ICD-10-CM | POA: Insufficient documentation

## 2014-10-01 ENCOUNTER — Encounter: Payer: Self-pay | Admitting: Family Medicine

## 2014-10-12 ENCOUNTER — Ambulatory Visit (INDEPENDENT_AMBULATORY_CARE_PROVIDER_SITE_OTHER): Payer: Managed Care, Other (non HMO) | Admitting: Family Medicine

## 2014-10-12 ENCOUNTER — Encounter: Payer: Self-pay | Admitting: Family Medicine

## 2014-10-12 VITALS — BP 158/102 | HR 94 | Temp 98.0°F | Wt 192.2 lb

## 2014-10-12 DIAGNOSIS — R197 Diarrhea, unspecified: Secondary | ICD-10-CM

## 2014-10-12 MED ORDER — CIPROFLOXACIN HCL 500 MG PO TABS: 500.0000 mg | ORAL_TABLET | Freq: Two times a day (BID) | ORAL | Status: DC

## 2014-10-12 MED ORDER — METRONIDAZOLE 500 MG PO TABS: 500.0000 mg | ORAL_TABLET | Freq: Three times a day (TID) | ORAL | Status: DC

## 2014-10-12 NOTE — Progress Notes (Signed)
Pre visit review using our clinic review tool, if applicable. No additional management support is needed unless otherwise documented below in the visit note. 

## 2014-10-12 NOTE — Patient Instructions (Signed)
Good to see you. Please take Align- one capsule daily. Take antibiotics as directed.  Please call us over the next day or two with an update.

## 2014-10-12 NOTE — Progress Notes (Signed)
Subjective:    Patient ID: Brad Cummings, male    DOB: 30-Jun-1951, 63 y.o.   MRN: [id number]  HPI  Diarrhea- saw him for watery diarrhea on 9/17.  At that time, we felt it was due to gastroenteritis because his daughter had similar symptoms the week prior.  It has never resolved- this weekend it became more frequent.  This morning, had 20 watery BMs with cramping.  Has had increased urgency. No blood or mucous in his stools.  Just left a stool sample in the lab. He has having abdominal pain/cramping- feels "tender" but not to touch.  Had a colonoscopy in 2008- ?diverticulosis and polyps.  Developed a staph infection s/p colonoscopy so has not had one since.  Lab Results  Component Value Date   CREATININE 1.1 08/16/2014   Lab Results  Component Value Date   ALT 32 08/16/2014   AST 34 08/16/2014   ALKPHOS 44 08/16/2014   BILITOT 0.6 08/16/2014    Current Outpatient Prescriptions on File Prior to Visit  Medication Sig Dispense Refill  . acetaminophen (TYLENOL) 500 MG tablet Take 500 mg by mouth every 6 (six) hours as needed.        Marland Kitchen atorvastatin (LIPITOR) 40 MG tablet TAKE 1 TABLET EVERY DAY  30 tablet  3  . escitalopram (LEXAPRO) 20 MG tablet TAKE 1 TABLET EVERY DAY  90 tablet  0  . fluticasone (FLONASE) 50 MCG/ACT nasal spray USE 2 SPRAYS IN EACH NOSTRIL EVERY DAY  16 g  2  . lisinopril-hydrochlorothiazide (PRINZIDE,ZESTORETIC) 20-12.5 MG per tablet TAKE ONE TABLET EVERY MORNING  30 tablet  5  . LORazepam (ATIVAN) 1 MG tablet TAKE ONE TABLET THREE TIMES A DAY IF NEEDED FOR ANXIETY  90 tablet  1   No current facility-administered medications on file prior to visit.    Allergies  Allergen Reactions  . Penicillins     No past medical history on file.  No past surgical history on file.  No family history on file.  History   Social History  . Marital Status: Married    Spouse Name: N/A    Number of Children: 1  . Years of Education: N/A   Occupational History  .  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 Smoking status: Never Smoker   . Smokeless tobacco: Not on file  . Alcohol Use: Not on file  . Drug Use: Not on file  . Sexual Activity: Not on file   Other Topics Concern  . Not on file   Social History Narrative  . No narrative on file   The PMH, PSH, Social History, Family History, Medications, and allergies have been reviewed in [facility], and have been updated if relevant.   Review of Systems See HPI No increased urinary frequency No dysuria    Objective:   Physical Exam  Nursing note and vitals reviewed. Constitutional: He appears well-developed and well-nourished. No distress.  Abdominal: Normal appearance and bowel sounds are normal. There is no hepatosplenomegaly. There is tenderness. There is no rigidity, no rebound and no guarding.  Mildly tender, diffusely, more LLQ  Skin: Skin is warm and dry.  Psychiatric: He has a normal mood and affect. His speech is normal and behavior is normal. Judgment and thought content normal.   BP 158/102  Pulse 94  Temp(Src) 98 F (36.7 C) (Oral)  Wt 192 lb 4 oz (87.204 kg)  SpO2 97%  Assessment & Plan:

## 2014-10-12 NOTE — Assessment & Plan Note (Signed)
Deteriorated. Etiology unclear at this point. Not classic symptoms but ?diverticulitis. Will refer to GI.  Abd exam reassuring- defer CT at this time. Given duration of symptoms, will treat with Cipro, Flagyl and Align. The patient indicates understanding of these issues and agrees with the plan.

## 2014-10-18 ENCOUNTER — Telehealth: Payer: Self-pay | Admitting: Family Medicine

## 2014-10-18 NOTE — Telephone Encounter (Signed)
Spoke to pt and informed him that the results are not back as of yet. Advised him that we will contact him upon their return

## 2014-10-18 NOTE — Telephone Encounter (Signed)
Pt is waiting to hear back from stool card dropped off 10/11/14.  States he would like call back today if possible.  Best number to call pt is [phone] / lt

## 2014-10-19 ENCOUNTER — Other Ambulatory Visit: Payer: Managed Care, Other (non HMO)

## 2014-10-20 ENCOUNTER — Encounter: Payer: Self-pay | Admitting: Family Medicine

## 2014-10-20 ENCOUNTER — Telehealth: Payer: Self-pay | Admitting: *Deleted

## 2014-10-20 ENCOUNTER — Other Ambulatory Visit (INDEPENDENT_AMBULATORY_CARE_PROVIDER_SITE_OTHER): Payer: Managed Care, Other (non HMO)

## 2014-10-20 ENCOUNTER — Other Ambulatory Visit: Payer: Self-pay | Admitting: Family Medicine

## 2014-10-20 DIAGNOSIS — Z1211 Encounter for screening for malignant neoplasm of colon: Secondary | ICD-10-CM

## 2014-10-20 DIAGNOSIS — R194 Change in bowel habit: Secondary | ICD-10-CM

## 2014-10-20 DIAGNOSIS — R197 Diarrhea, unspecified: Secondary | ICD-10-CM

## 2014-10-20 DIAGNOSIS — Z860100 Personal history of colon polyps, unspecified: Secondary | ICD-10-CM

## 2014-10-20 LAB — FECAL OCCULT BLOOD, IMMUNOCHEMICAL: Fecal Occult Bld: NEGATIVE

## 2014-10-20 NOTE — Telephone Encounter (Signed)
Pt called requesting ifob results, he returned the stool kit almost 2 weeks ago and hadn't heard anything. I informed the pt that the test are usually tested in batches, and that it may take a couple of weeks for a result. I informed him I would call the lab in [place] to find out the status of his test. I called the lab and spoke with [facility]. She said that the test had been performed yesterday and it was negative, but needed an order put in to be resulted. I put in the order, and it's pending result in Epic. I called the pt back, informed him his test was negative, and advised that I would pass the info to Dr. Deborra Medina. Pt is anxious for further advise because he has been having "loose black stools that look like chocolate pudding sitting on top of the water" since starting the meds prescribed at his last visit. He has increased water intake as directed on the med bottle. His appetite has decreased, and he has only been eating chicken noodle soup with crackers. He c/o his stomach being "torn up" after eating. Please advise?

## 2014-10-20 NOTE — Telephone Encounter (Signed)
Terri, please see below.

## 2014-10-21 ENCOUNTER — Other Ambulatory Visit: Payer: Self-pay | Admitting: Family Medicine

## 2014-10-21 DIAGNOSIS — R194 Change in bowel habit: Secondary | ICD-10-CM | POA: Insufficient documentation

## 2014-10-21 NOTE — Telephone Encounter (Signed)
Spoke to pt and advised per Dr Deborra Medina. Pt states that he will await a call with appt details

## 2014-10-21 NOTE — Telephone Encounter (Signed)
I am sorry to hear he is not feeling better.  At this point, I need to refer him to GI.  I will place referral and hopefully we can get him in quickly.

## 2014-10-22 ENCOUNTER — Encounter: Payer: Self-pay | Admitting: Physician Assistant

## 2014-10-22 NOTE — Telephone Encounter (Signed)
Pt requesting medication refill. Pt last f/u appt 07/2014-CPE. Ok to fill per Dr Deborra Medina. Rx called in to requested pharmacy

## 2014-10-28 ENCOUNTER — Encounter: Payer: Self-pay | Admitting: Gastroenterology

## 2014-11-01 ENCOUNTER — Encounter: Payer: Self-pay | Admitting: Physician Assistant

## 2014-11-01 ENCOUNTER — Ambulatory Visit (INDEPENDENT_AMBULATORY_CARE_PROVIDER_SITE_OTHER): Payer: Managed Care, Other (non HMO) | Admitting: Physician Assistant

## 2014-11-01 ENCOUNTER — Other Ambulatory Visit (INDEPENDENT_AMBULATORY_CARE_PROVIDER_SITE_OTHER): Payer: Managed Care, Other (non HMO)

## 2014-11-01 VITALS — BP 136/78 | HR 97 | Ht 69.5 in | Wt 190.8 lb

## 2014-11-01 DIAGNOSIS — R197 Diarrhea, unspecified: Secondary | ICD-10-CM

## 2014-11-01 DIAGNOSIS — Z86010 Personal history of colonic polyps: Secondary | ICD-10-CM

## 2014-11-01 DIAGNOSIS — Z800 Family history of malignant neoplasm of digestive organs: Secondary | ICD-10-CM

## 2014-11-01 LAB — COMPREHENSIVE METABOLIC PANEL
ALT: 30 U/L (ref 0–53)
AST: 28 U/L (ref 0–37)
Albumin: 4 g/dL (ref 3.5–5.2)
Alkaline Phosphatase: 51 U/L (ref 39–117)
BUN: 10 mg/dL (ref 6–23)
CO2: 29 meq/L (ref 19–32)
Calcium: 9.6 mg/dL (ref 8.4–10.5)
Chloride: 99 mEq/L (ref 96–112)
Creatinine, Ser: 1 mg/dL (ref 0.4–1.5)
GFR: 80.6 mL/min (ref >60.00)
GLUCOSE: 85 mg/dL (ref 70–99)
Potassium: 3.8 mEq/L (ref 3.5–5.1)
Sodium: 137 mEq/L (ref 135–145)
Total Bilirubin: 0.9 mg/dL (ref 0.2–1.2)
Total Protein: 7.9 g/dL (ref 6.0–8.3)

## 2014-11-01 LAB — CBC WITH DIFFERENTIAL/PLATELET
Basophils Absolute: 0 10*3/uL (ref 0.0–0.1)
Basophils Relative: 0.3 % (ref 0.0–3.0)
EOS PCT: 0.9 % (ref 0.0–5.0)
Eosinophils Absolute: 0.1 10*3/uL (ref 0.0–0.7)
HEMATOCRIT: 48.5 % (ref 39.0–52.0)
Hemoglobin: 16 g/dL (ref 13.0–17.0)
LYMPHS ABS: 1.7 10*3/uL (ref 0.7–4.0)
Lymphocytes Relative: 19.7 % (ref 12.0–46.0)
MCHC: 33 g/dL (ref 30.0–36.0)
MCV: 100.1 fl — AB (ref 78.0–100.0)
MONOS PCT: 12.8 % — AB (ref 3.0–12.0)
Monocytes Absolute: 1.1 10*3/uL — ABNORMAL HIGH (ref 0.1–1.0)
NEUTROS ABS: 5.9 10*3/uL (ref 1.4–7.7)
Neutrophils Relative %: 66.3 % (ref 43.0–77.0)
Platelets: 482 10*3/uL — ABNORMAL HIGH (ref 150.0–400.0)
RBC: 4.85 Mil/uL (ref 4.22–5.81)
RDW: 13.3 % (ref 11.5–15.5)
WBC: 8.9 10*3/uL (ref 4.0–10.5)

## 2014-11-01 LAB — TSH: TSH: 2.35 u[IU]/mL (ref 0.35–4.50)

## 2014-11-01 LAB — IGA: IGA: 368 mg/dL (ref 68–378)

## 2014-11-01 MED ORDER — LOPERAMIDE HCL 2 MG PO TABS: 2.0000 mg | ORAL_TABLET | ORAL | Status: DC

## 2014-11-01 MED ORDER — MOVIPREP 100 G PO SOLR: 1.0000 | Freq: Once | ORAL | Status: DC

## 2014-11-01 NOTE — Patient Instructions (Addendum)
Your physician has requested that you go to the basement for lab work before leaving today.  You have been scheduled for a colonoscopy. Please follow written instructions given to you at your visit today.  Please pick up your prep kit at the pharmacy within the next 1-3 days. If you use inhalers (even only as needed), please bring them with you on the day of your procedure. Your physician has requested that you go to www.[email or web address].com and enter the access code given to you at your visit today. This web site gives a general overview about your procedure. However, you should still follow specific instructions given to you by our office regarding your preparation for the procedure.  Please purchase Imodium over the counter and use as directed.  Low-Fiber Diet Fiber is found in fruits, vegetables, and whole grains. A low-fiber diet restricts fibrous foods that are not digested in the small intestine. A diet containing about 10-15 grams of fiber per day is considered low fiber. Low-fiber diets may be used to:  Promote healing and rest the bowel during intestinal flare-ups.  Prevent blockage of a partially obstructed or narrowed gastrointestinal tract.  Reduce fecal weight and volume.  Slow the movement of feces. You may be on a low-fiber diet as a transitional diet following surgery, after an injury (trauma), or because of a short (acute) or lifelong (chronic) illness. Your health care provider will determine the length of time you need to stay on this diet.  WHAT DO I NEED TO KNOW ABOUT A LOW-FIBER DIET? Always check the fiber content on the packaging's Nutrition Facts label, especially on foods from the grains list. Ask your dietitian if you have questions about specific foods that are related to your condition, especially if the food is not listed below. In general, a low-fiber food will have less than 2 g of fiber. WHAT FOODS CAN I EAT? Grains All breads and crackers made with white flour.  Sweet rolls, doughnuts, waffles, pancakes, [place] toast, bagels. Pretzels, Melba toast, zwieback. Well-cooked cereals, such as cornmeal, farina, or cream cereals. Dry cereals that do not contain whole grains, fruit, or nuts, such as refined corn, wheat, rice, and oat cereals. Potatoes prepared any way without skins, plain pastas and noodles, refined white rice. Use white flour for baking and making sauces. Use allowed list of grains for casseroles, dumplings, and puddings.  Vegetables Strained tomato and vegetable juices. Fresh lettuce, cucumber, spinach. Well-cooked (no skin or pulp) or canned vegetables, such as asparagus, bean sprouts, beets, carrots, green beans, mushrooms, potatoes, pumpkin, spinach, yellow squash, tomato sauce/puree, turnips, yams, and zucchini. Keep servings limited to  cup.  Fruits All fruit juices except prune juice. Cooked or canned fruits without skin and seeds, such as applesauce, apricots, cherries, fruit cocktail, grapefruit, grapes, mandarin oranges, melons, peaches, pears, pineapple, and plums. Fresh fruits without skin, such as apricots, avocados, bananas, melons, pineapple, nectarines, and peaches. Keep servings limited to  cup or 1 piece.  Meat and Other Protein Sources Ground or well-cooked tender beef, ham, veal, lamb, pork, or poultry. Eggs, plain cheese. Fish, oysters, shrimp, lobster, and other seafood. Liver, organ meats. Smooth nut butters. Dairy All milk products and alternative dairy substitutes, such as soy, rice, almond, and coconut, not containing added whole nuts, seeds, or added fruit. Beverages Decaf coffee, fruit, and vegetable juices or smoothies (small amounts, with no pulp or skins, and with fruits from allowed list), sports drinks, herbal tea. Condiments Ketchup, mustard, vinegar, cream sauce, cheese sauce, cocoa  powder. Spices in moderation, such as allspice, basil, bay leaves, celery powder or leaves, cinnamon, cumin powder, curry powder,  ginger, mace, marjoram, onion or garlic powder, oregano, paprika, parsley flakes, ground pepper, rosemary, sage, savory, tarragon, thyme, and turmeric. Sweets and Desserts Plain cakes and cookies, pie made with allowed fruit, pudding, custard, cream pie. Gelatin, fruit, ice, sherbet, frozen ice pops. Ice cream, ice milk without nuts. Plain hard candy, honey, jelly, molasses, syrup, sugar, chocolate syrup, gumdrops, marshmallows. Limit overall sugar intake.  Fats and Oil Margarine, butter, cream, mayonnaise, salad oils, plain salad dressings made from allowed foods. Choose healthy fats such as olive oil, canola oil, and omega-3 fatty acids (such as found in salmon or tuna) when possible.  Other Bouillon, broth, or cream soups made from allowed foods. Any strained soup. Casseroles or mixed dishes made with allowed foods. The items listed above may not be a complete list of recommended foods or beverages. Contact your dietitian for more options.  WHAT FOODS ARE NOT RECOMMENDED? Grains All whole wheat and whole grain breads and crackers. Multigrains, rye, bran seeds, nuts, or coconut. Cereals containing whole grains, multigrains, bran, coconut, nuts, raisins. Cooked or dry oatmeal, steel-cut oats. Coarse wheat cereals, granola. Cereals advertised as high fiber. Potato skins. Whole grain pasta, wild or brown rice. Popcorn. Coconut flour. Bran, buckwheat, corn bread, multigrains, rye, wheat germ.  Vegetables Fresh, cooked or canned vegetables, such as artichokes, asparagus, beet greens, broccoli, Brussels sprouts, cabbage, celery, cauliflower, corn, eggplant, kale, legumes or beans, okra, peas, and tomatoes. Avoid large servings of any vegetables, especially raw vegetables.  Fruits Fresh fruits, such as apples with or without skin, berries, cherries, figs, grapes, grapefruit, guavas, kiwis, mangoes, oranges, papayas, pears, persimmons, pineapple, and pomegranate. Prune juice and juices with pulp, stewed or  dried prunes. Dried fruits, dates, raisins. Fruit seeds or skins. Avoid large servings of all fresh fruits. Meats and Other Protein Sources Tough, fibrous meats with gristle. Chunky nut butter. Cheese made with seeds, nuts, or other foods not recommended. Nuts, seeds, legumes (beans, including baked beans), dried peas, beans, lentils.  Dairy Yogurt or cheese that contains nuts, seeds, or added fruit.  Beverages Fruit juices with high pulp, prune juice. Caffeinated coffee and teas.  Condiments Coconut, maple syrup, pickles, olives. Sweets and Desserts Desserts, cookies, or candies that contain nuts or coconut, chunky peanut butter, dried fruits. Jams, preserves with seeds, marmalade. Large amounts of sugar and sweets. Any other dessert made with fruits from the not recommended list.  Other Soups made from vegetables that are not recommended or that contain other foods not recommended.  The items listed above may not be a complete list of foods and beverages to avoid. Contact your dietitian for more information. Document Released: 06/08/2002 Document Revised: 12/22/2013 Document Reviewed: 11/09/2013 [facility] Patient Information 2015 [place], [place]. This information is not intended to replace advice given to you by your health care provider. Make sure you discuss any questions you have with your health care provider.

## 2014-11-01 NOTE — Progress Notes (Signed)
I agree with the above note, plan 

## 2014-11-01 NOTE — Progress Notes (Signed)
Patient ID: Brad Cummings, male   DOB: 1951/06/03, 63 y.o.   MRN: [id number]    HPI: Brad Cummings is a 63 year old male referred for evaluation by Dr. Arnette Norris due to a 2 month history of diarrhea.  Patient states that approximately 2-2-1/2 months ago he started having diarrhea. He states at the time he had been started on prednisone for his left shoulder by a physician at [facility]. Once he completed his prednisone, he was started on a course of Cipro and Flagyl by his primary care physician. He says this almost killed him, but he will not elaborate. For the past 2 months, he states that ingestion of any canned foods causes immediate diarrhea he has been having 10-12 bowel movements daily which are watery to mushy in consistency, with no blood. He does have nocturnal stooling and urgency. He started himself on a probiotic at his daughter's recommendation 2 weeks ago. He does not know the name of the probiotic that he is taking what he states he feels a little bit better while taking it. His stools look very oily and 10 to float. He gets crampy abdominal pain prior to defecation and the pain is relieved with defecation his appetite has been stable and he's had no weight loss.He has no bright red blood per rectum and no melena. He has no mucus with his stools, and he denies tenesmus.  Prior to the onset of his diarrhea he had not traveled outside of the [place], and he had not had any antibiotics. He has no new pets. [place] Today he says no stool cultures have been done. He has no associated new unusual eye pain, joint pain, skin rashes, or oral ulcers. He states he had a brother who was diagnosed with colon cancer at age 71 and had surgery. His father has had colon polyps removed several times. The patient himself has had 3 colonoscopies. He states he has had polyps removed and was told to have repeat colonoscopies in 3-5 years. His last colonoscopy was  at [facility] in 2008. After his colonoscopy he states he was told he had a hemorrhoid however over 2 weeks the hemorrhoid became very large and sore. He was seen in follow-up in diagnosed with a perirectal abscess that had to be drained.    Past Medical History  Diagnosis Date  . HLD (hyperlipidemia)   . HTN (hypertension)   . Ventral hernia   . Hx of colonic polyps   . Anxiety   . Staph infection   . Colon polyp     Past Surgical History  Procedure Laterality Date  . Laparoscopy      umbilical hernia  . Knee scope Left 1989  . Colonoscopy w/ biopsies    . Umbilcal hernia repair N/A    Family History  Problem Relation Age of Onset  . Colon polyps Brother   . Colon polyps Father   . Colon cancer Brother 58    removed a foot of his colon  . Diabetes Father   . Heart disease Father   . Heart disease Brother    History  Substance Use Topics  . Smoking status: Never Smoker   . Smokeless tobacco: Never Used  . Alcohol Use: 0.0 oz/week    0 Not specified per week     Comment: 5 beers a day/ or 3 glasses of wine   Current Outpatient Prescriptions  Medication Sig  Dispense Refill  . acetaminophen (TYLENOL) 500 MG tablet Take 500 mg by mouth every 6 (six) hours as needed.      Marland Kitchen atorvastatin (LIPITOR) 40 MG tablet TAKE 1 TABLET EVERY DAY 30 tablet 3  . escitalopram (LEXAPRO) 20 MG tablet TAKE 1 TABLET EVERY DAY 90 tablet 0  . fluticasone (FLONASE) 50 MCG/ACT nasal spray USE 2 SPRAYS IN EACH NOSTRIL EVERY DAY 16 g 2  . lisinopril-hydrochlorothiazide (PRINZIDE,ZESTORETIC) 20-12.5 MG per tablet TAKE ONE TABLET EVERY MORNING 30 tablet 5  . LORazepam (ATIVAN) 1 MG tablet TAKE ONE TABLET THREE TIMES A DAY IF NEEDED FOR ANXIETY 90 tablet 1  . Probiotic Product (PROBIOTIC DAILY PO) Take 1 capsule by mouth daily.    . ciprofloxacin (CIPRO) 500 MG tablet Take 1 tablet (500 mg total) by mouth 2 (two) times daily. 14 tablet 0  . loperamide (IMODIUM A-D) 2 MG tablet Take 1 tablet (2  mg total) by mouth as directed. 30 tablet 0  . metroNIDAZOLE (FLAGYL) 500 MG tablet Take 1 tablet (500 mg total) by mouth 3 (three) times daily. 21 tablet 0  . MOVIPREP 100 G SOLR Take 1 kit (200 g total) by mouth once. 1 kit 0   No current facility-administered medications for this visit.   Allergies  Allergen Reactions  . Penicillins      Review of Systems: Gen: Denies any fever, chills, sweats, anorexia, fatigue, weakness, malaise, weight loss, and sleep disorder CV: Denies chest pain, angina, palpitations, syncope, orthopnea, PND, peripheral edema, and claudication. Resp: Denies dyspnea at rest, dyspnea with exercise, cough, sputum, wheezing, coughing up blood, and pleurisy. GI: Denies vomiting blood, jaundice, and fecal incontinence.   Denies dysphagia or odynophagia. GU : Denies urinary burning, blood in urine, urinary frequency, urinary hesitancy, nocturnal urination, and urinary incontinence. MS: Denies joint pain, limitation of movement, and swelling, stiffness, low back pain, extremity pain. Denies muscle weakness, cramps, atrophy.  Derm: Denies rash, itching, dry skin, hives, moles, warts, or unhealing ulcers.  Psych: Denies depression, anxiety, memory loss, suicidal ideation, hallucinations, paranoia, and confusion. Heme: Denies bruising, bleeding, and enlarged lymph nodes. Neuro:  Denies any headaches, dizziness, paresthesias. Endo:  Denies any problems with DM, thyroid, adrenal function    Prior Endoscopies:  Colonoscopy report from 02/01/1998 was obtained from [facility]. The patient had a 5 mm sessile polyp in the cecum and a 6 mm sessile polyp in the rectum removed the cecal polyp was found to be adenomatous, and the rectal polyp was found to be hyperplastic. He was advised to have her surveillance colonoscopy in 2 years.  Physical Exam: BP 136/78 mmHg  Pulse 97  Ht 5' 9.5" (1.765 m)  Wt 190 lb 12.8 oz (86.546 kg)  BMI 27.78  kg/m2 Constitutional: Pleasant,well-developed, well nourished male in no acute distress. HEENT: Normocephalic and atraumatic. Conjunctivae are normal. No scleral icterus. Neck supple. No thyromegaly Cardiovascular: Normal rate, regular rhythm.  Pulmonary/chest: Effort normal and breath sounds normal. No wheezing, rales or rhonchi. Abdominal: Soft, nondistended, nontender. Bowel sounds active throughout. There are no masses palpable. No hepatomegaly. Extremities: no edema Lymphadenopathy: No cervical adenopathy noted. Neurological: Alert and oriented to person place and time. Skin: Skin is warm and dry. No rashes noted. Psychiatric: Normal mood and affect. Behavior is normal.  ASSESSMENT AND PLAN: 63 year old white male with a two-month history of diarrhea, a personal history of colon polyps, and a family history of colon cancer referred for evaluation. A CBC, comprehensive metabolic panel, celiac  panel with TTG and IgA, and a TSH will be obtained. A GI pathology stool panel as well as a stool for C. difficile and a stool for Giardia will be obtained. The patient has been advised to adhere to a low residue, low-fat diet. He's been advised to use Imodium as directed on the package. He will be scheduled for colonoscopy to evaluate for recurrent polyps, neoplasia, or inflammatory process.The risks, benefits, and alternatives to colonoscopy with possible biopsy and possible polypectomy were discussed with the patient and they consent to proceed. The procedure will be scheduled with Dr. Ardis Hughs. The patient has signed a medical release to obtain the reports from his colonoscopy in 2008 along with the pathology report. Further recommendations will be made pending the findings of his upcoming colonoscopy.    Perseus Westall, Vita Barley PA-C 11/01/2014, 11:13 AM

## 2014-11-02 ENCOUNTER — Other Ambulatory Visit (INDEPENDENT_AMBULATORY_CARE_PROVIDER_SITE_OTHER): Payer: Managed Care, Other (non HMO)

## 2014-11-02 DIAGNOSIS — E538 Deficiency of other specified B group vitamins: Secondary | ICD-10-CM

## 2014-11-02 LAB — FOLATE: Folate: 24.5 ng/mL (ref >5.9)

## 2014-11-02 LAB — TISSUE TRANSGLUTAMINASE, IGA: TISSUE TRANSGLUTAMINASE AB, IGA: 9.1 U/mL (ref <20)

## 2014-11-02 LAB — VITAMIN B12: Vitamin B-12: 404 pg/mL (ref 211–911)

## 2014-11-16 ENCOUNTER — Encounter: Payer: Self-pay | Admitting: Gastroenterology

## 2014-11-26 ENCOUNTER — Other Ambulatory Visit: Payer: Self-pay | Admitting: Family Medicine

## 2014-11-30 ENCOUNTER — Other Ambulatory Visit: Payer: Self-pay | Admitting: Family Medicine

## 2014-12-09 ENCOUNTER — Telehealth: Payer: Self-pay | Admitting: Gastroenterology

## 2014-12-09 NOTE — Telephone Encounter (Signed)
Returned patient's call and no answer.  Left message for him to call me back at [phone]

## 2014-12-09 NOTE — Telephone Encounter (Signed)
Pt states he ate a country ham biscuit this am and drank a diet pepsi. He did not read his instructions until after he ate. Pt was instructed to drink lots of extra fluids throughout the rest of today. Follow his instructions from this point, starting prep at 5 pm. No more solid foods today.  Instructed nothing red or purple but he must drink a lot to help wash out the am breakfast. He verbalized understanding of these instructions. i also told pt do not eat tomorrow am prior to his procedure. Told multiple times no solid foods the rest of today. He verbalized understanding  Lelan Pons PV

## 2014-12-10 ENCOUNTER — Encounter: Payer: Self-pay | Admitting: Gastroenterology

## 2014-12-10 ENCOUNTER — Telehealth: Payer: Self-pay | Admitting: Gastroenterology

## 2014-12-10 ENCOUNTER — Ambulatory Visit (AMBULATORY_SURGERY_CENTER): Payer: Managed Care, Other (non HMO) | Admitting: Gastroenterology

## 2014-12-10 VITALS — BP 144/98 | HR 70 | Temp 97.9°F | Resp 18 | Ht 69.5 in | Wt 190.0 lb

## 2014-12-10 DIAGNOSIS — D125 Benign neoplasm of sigmoid colon: Secondary | ICD-10-CM

## 2014-12-10 DIAGNOSIS — D123 Benign neoplasm of transverse colon: Secondary | ICD-10-CM

## 2014-12-10 DIAGNOSIS — Z86010 Personal history of colonic polyps: Secondary | ICD-10-CM

## 2014-12-10 DIAGNOSIS — R197 Diarrhea, unspecified: Secondary | ICD-10-CM

## 2014-12-10 DIAGNOSIS — D120 Benign neoplasm of cecum: Secondary | ICD-10-CM

## 2014-12-10 DIAGNOSIS — D122 Benign neoplasm of ascending colon: Secondary | ICD-10-CM

## 2014-12-10 DIAGNOSIS — D124 Benign neoplasm of descending colon: Secondary | ICD-10-CM

## 2014-12-10 MED ORDER — SODIUM CHLORIDE 0.9 % IV SOLN: 500.0000 mL | INTRAVENOUS | Status: DC

## 2014-12-10 NOTE — Progress Notes (Signed)
Called to room to assist during endoscopic procedure.  Patient ID and intended procedure confirmed with present staff. Received instructions for my participation in the procedure from the performing physician.  

## 2014-12-10 NOTE — Progress Notes (Signed)
Report to PACU, RN, vss, BBS= Clear.  

## 2014-12-10 NOTE — Op Note (Signed)
[facility]  [address]. [place], [id number]   COLONOSCOPY PROCEDURE REPORT  PATIENT: Brad Cummings, Brad Cummings  MR#: [id number] BIRTHDATE: [birth year] , 77  yrs. old GENDER: male ENDOSCOPIST: Milus Banister, MD PROCEDURE DATE:  12/10/2014 PROCEDURE:   Colonoscopy with biopsy, Colonoscopy with snare polypectomy, and Submucosal injection, any substance First Screening Colonoscopy - Avg.  risk and is 50 yrs.  old or older - No.  Prior Negative Screening - Now for repeat screening. N/A  History of Adenoma - Now for follow-up colonoscopy & has been > or = to 3 yrs.  N/A  Polyps Removed Today? Yes. ASA CLASS:   Class II INDICATIONS:chronic loose stools. MEDICATIONS: Monitored anesthesia care and Propofol 500 mg IV  DESCRIPTION OF PROCEDURE:   After the risks benefits and alternatives of the procedure were thoroughly explained, informed consent was obtained.  The digital rectal exam revealed no abnormalities of the rectum.   The LB [id number]  endoscope was introduced through the anus and advanced to the terminal ileum which was intubated for a short distance. No adverse events experienced.   The quality of the prep was excellent.  The instrument was then slowly withdrawn as the colon was fully examined.  COLON FINDINGS: The terminal ileum was normal.  The colonic mucosa was diffusely, mildly inflamed.  Biopsies were taken from right colon (jar 2) and left colon (jar 3) randomly.  Five polyps were found, removed and sent to pathology.  Three of them ranged in size from 4-63mm across, sessile, located in cecium, ascending and sigmoid segments, removed with cold snare (jar 1).  One was sessile, 1.4cm across, located in transverse segment, removed piecemeal fashion with cold snare, hot snare, sent to pathology (jar 4).  This site was labeled with submucosal injection of Spot following polyp resection.  The last was sessile, 1.5cm across, located in descending segment,  removed piecemeal fashion with cold snare sent to pathology (jar 5).  This site was labeled with submucosal injection of Spot following polyp resection.  The examination was otherwise normal.  Retroflexed views revealed no abnormalities. The time to cecum=2 minutes 32 seconds.  Withdrawal time=29 minutes 14 seconds.  The scope was withdrawn and the procedure completed. COMPLICATIONS: There were no immediate complications.  ENDOSCOPIC IMPRESSION: The terminal ileum was normal. The colonic mucosa was diffusely, mildly inflamed.  Biopsies were taken from right colon (jar 2) and left colon (jar 3) randomly. Five polyps were found, removed and sent to pathology. The examination was otherwise normal  RECOMMENDATIONS: Await final pathology.  you will likely need repeat colonoscopy in 6 months given piecemeal resection technique.  eSigned:  Milus Banister, MD 12/10/2014 10:44 AM

## 2014-12-10 NOTE — Patient Instructions (Signed)

## 2014-12-13 ENCOUNTER — Telehealth: Payer: Self-pay | Admitting: *Deleted

## 2014-12-13 NOTE — Telephone Encounter (Signed)
No answer, message left for the patient. 

## 2014-12-16 ENCOUNTER — Other Ambulatory Visit: Payer: Self-pay | Admitting: Family Medicine

## 2014-12-16 NOTE — Telephone Encounter (Signed)
Pt requesting medication refill. Last f/u appt 07/2014-CPE. pls advise 

## 2014-12-16 NOTE — Telephone Encounter (Signed)
Rx called in to requested pharmacy 

## 2014-12-21 ENCOUNTER — Encounter: Payer: Self-pay | Admitting: Gastroenterology

## 2014-12-22 ENCOUNTER — Telehealth: Payer: Self-pay | Admitting: Gastroenterology

## 2014-12-22 NOTE — Telephone Encounter (Signed)
The pt was advised that a letter was mailed and I also read him the letter and answered any questions.  He will call back with any concerns

## 2015-01-13 ENCOUNTER — Other Ambulatory Visit: Payer: Self-pay | Admitting: Family Medicine

## 2015-01-13 NOTE — Telephone Encounter (Signed)
Pt requesting medication refill. Last f/u appt 07/2014-CPE. pls advise

## 2015-01-14 NOTE — Telephone Encounter (Signed)
Rx called in to requested pharmacy 

## 2015-02-10 ENCOUNTER — Other Ambulatory Visit: Payer: Self-pay | Admitting: Family Medicine

## 2015-02-10 NOTE — Telephone Encounter (Signed)
Last f/u appt 07/2014 

## 2015-02-11 NOTE — Telephone Encounter (Signed)
Rx called in to requested pharmacy 

## 2015-02-11 NOTE — Telephone Encounter (Signed)
Ok to phone in [place] to be filled on or after 02/14/15

## 2015-02-19 ENCOUNTER — Other Ambulatory Visit: Payer: Self-pay | Admitting: Family Medicine

## 2015-03-07 ENCOUNTER — Other Ambulatory Visit: Payer: Self-pay | Admitting: Family Medicine

## 2015-03-10 ENCOUNTER — Other Ambulatory Visit: Payer: Self-pay | Admitting: Family Medicine

## 2015-03-10 NOTE — Telephone Encounter (Signed)
Last f/u appt 07/2014. Pt due for UDS

## 2015-03-10 NOTE — Telephone Encounter (Signed)
Rx called in to requested pharmacy 

## 2015-03-14 ENCOUNTER — Other Ambulatory Visit: Payer: Self-pay | Admitting: Family Medicine

## 2015-04-06 ENCOUNTER — Telehealth: Payer: Self-pay | Admitting: *Deleted

## 2015-04-06 MED ORDER — LORAZEPAM 1 MG PO TABS: ORAL_TABLET | ORAL | Status: DC

## 2015-04-06 NOTE — Telephone Encounter (Signed)
Last f/u appt 07/2014-CPE

## 2015-04-07 NOTE — Telephone Encounter (Addendum)
Pt request status of lorazepam refill; spoke with [facility] and rx ready for pick up. Pt said he has not had lorazepam since yesterday and now he feels shaky; pt wants to know if could increase dose or how many times a day pt takes med if needs to for anxiety attacks. Pt already has appt with Dr Deborra Medina scheduled on 04/11/15 at 8:30 AM. Pt said he will wait and talk with Dr Deborra Medina at 04/11/15 appt and if condition changes or worsens prior to appt pt will cb. Pt will pick up med at [facility]. Message sent to Dr Deborra Medina as Juluis Rainier.

## 2015-04-07 NOTE — Telephone Encounter (Signed)
Rx called in to requested pharmacy 

## 2015-04-11 ENCOUNTER — Ambulatory Visit: Payer: Managed Care, Other (non HMO) | Admitting: Family Medicine

## 2015-04-11 DIAGNOSIS — Z0289 Encounter for other administrative examinations: Secondary | ICD-10-CM

## 2015-04-13 ENCOUNTER — Ambulatory Visit (INDEPENDENT_AMBULATORY_CARE_PROVIDER_SITE_OTHER): Payer: Managed Care, Other (non HMO) | Admitting: Family Medicine

## 2015-04-13 ENCOUNTER — Encounter: Payer: Self-pay | Admitting: Family Medicine

## 2015-04-13 VITALS — BP 138/76 | HR 92 | Temp 98.6°F | Wt 192.5 lb

## 2015-04-13 DIAGNOSIS — I10 Essential (primary) hypertension: Secondary | ICD-10-CM

## 2015-04-13 DIAGNOSIS — F411 Generalized anxiety disorder: Secondary | ICD-10-CM | POA: Diagnosis not present

## 2015-04-13 DIAGNOSIS — R140 Abdominal distension (gaseous): Secondary | ICD-10-CM | POA: Diagnosis not present

## 2015-04-13 DIAGNOSIS — J301 Allergic rhinitis due to pollen: Secondary | ICD-10-CM

## 2015-04-13 DIAGNOSIS — J309 Allergic rhinitis, unspecified: Secondary | ICD-10-CM | POA: Insufficient documentation

## 2015-04-13 MED ORDER — ESCITALOPRAM OXALATE 20 MG PO TABS: 20.0000 mg | ORAL_TABLET | Freq: Every day | ORAL | Status: DC

## 2015-04-13 NOTE — Patient Instructions (Addendum)
Good to see you.  I am so sorry to hear about what you are going through.  Please hold your probiotic for a few days.  Take Gas X or Bean O for bloating.  Also can take over the counter Pepcid.  Please stop by to see Rosaria Ferries on your way out.   Start an over the counter antihistamine like zyrtec or claritin daily to help with your sinus congestion.  Please stay away from anything that has a "D" behind it as they can elevated your blood pressure.

## 2015-04-13 NOTE — Assessment & Plan Note (Signed)
Deteriorated due to acute stressors. >25 minutes spent in face to face time with patient, >50% spent in counselling or coordination of care Explained that I will not increase his ativan dose but I can refer him to a psychiatrist for medication management.  He agrees to this.  Referral placed.
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
## 2015-04-13 NOTE — Progress Notes (Signed)
Pre visit review using our clinic review tool, if applicable. No additional management support is needed unless otherwise documented below in the visit note. 

## 2015-04-13 NOTE — Assessment & Plan Note (Signed)
Deteriorated. Add antihistamine to inhaled steroid. Call or return to clinic prn if these symptoms worsen or fail to improve as anticipated. The patient indicates understanding of these issues and agrees with the plan.

## 2015-04-13 NOTE — Assessment & Plan Note (Signed)
New- advised Gas X and Beano, ok to add pepcid as well. Hold probiotics for a few days. Call or return to clinic prn if these symptoms worsen or fail to improve as anticipated. The patient indicates understanding of these issues and agrees with the plan.

## 2015-04-29 ENCOUNTER — Telehealth: Payer: Self-pay | Admitting: Family Medicine

## 2015-04-29 NOTE — Telephone Encounter (Signed)
Patient stated that he was supposed to be referred to a physicist, no one has called him yet, please advise

## 2015-04-29 NOTE — Telephone Encounter (Signed)
Brad Cummings spoke with Brad Cummings at [facility] and re-faxed referral  It was originally faxed 04/15/15. Brad Cummings is going to try and expedite referral adn contact pt.

## 2015-05-05 ENCOUNTER — Other Ambulatory Visit: Payer: Self-pay | Admitting: Family Medicine

## 2015-05-05 ENCOUNTER — Telehealth: Payer: Self-pay | Admitting: Family Medicine

## 2015-05-05 NOTE — Telephone Encounter (Signed)
Last f/u appt 04/2015 

## 2015-05-05 NOTE — Telephone Encounter (Signed)
Rx called in to requested pharmacy 

## 2015-05-06 NOTE — Telephone Encounter (Signed)
Pt left v/m requesting cb about why needs to come in for appt when pt was recently seen. Last lipid 08/2014.

## 2015-05-09 NOTE — Telephone Encounter (Signed)
Spoke to pt and informed him a lab visit is required for additional refills.

## 2015-05-20 ENCOUNTER — Encounter: Payer: Self-pay | Admitting: Gastroenterology

## 2015-05-23 ENCOUNTER — Other Ambulatory Visit: Payer: Self-pay | Admitting: Family Medicine

## 2015-05-23 DIAGNOSIS — D751 Secondary polycythemia: Secondary | ICD-10-CM

## 2015-05-23 DIAGNOSIS — E785 Hyperlipidemia, unspecified: Secondary | ICD-10-CM

## 2015-05-24 ENCOUNTER — Other Ambulatory Visit (INDEPENDENT_AMBULATORY_CARE_PROVIDER_SITE_OTHER): Payer: Managed Care, Other (non HMO)

## 2015-05-24 DIAGNOSIS — E785 Hyperlipidemia, unspecified: Secondary | ICD-10-CM

## 2015-05-24 LAB — COMPREHENSIVE METABOLIC PANEL
ALK PHOS: 61 U/L (ref 39–117)
ALT: 26 U/L (ref 0–53)
AST: 29 U/L (ref 0–37)
Albumin: 4.5 g/dL (ref 3.5–5.2)
BUN: 13 mg/dL (ref 6–23)
CO2: 23 meq/L (ref 19–32)
Calcium: 9.1 mg/dL (ref 8.4–10.5)
Chloride: 98 mEq/L (ref 96–112)
Creatinine, Ser: 1.14 mg/dL (ref 0.40–1.50)
GFR: 68.38 mL/min (ref >60.00)
Glucose, Bld: 160 mg/dL — ABNORMAL HIGH (ref 70–99)
Potassium: 3.4 mEq/L — ABNORMAL LOW (ref 3.5–5.1)
Sodium: 131 mEq/L — ABNORMAL LOW (ref 135–145)
Total Bilirubin: 0.5 mg/dL (ref 0.2–1.2)
Total Protein: 7.5 g/dL (ref 6.0–8.3)

## 2015-05-24 LAB — LIPID PANEL
Cholesterol: 216 mg/dL — ABNORMAL HIGH (ref 0–200)
HDL: 55.4 mg/dL (ref >39.00)
Total CHOL/HDL Ratio: 4
Triglycerides: 807 mg/dL — ABNORMAL HIGH (ref 0.0–149.0)

## 2015-05-24 LAB — LDL CHOLESTEROL, DIRECT: LDL DIRECT: 60 mg/dL

## 2015-06-01 ENCOUNTER — Ambulatory Visit: Payer: Managed Care, Other (non HMO) | Admitting: Family Medicine

## 2015-06-01 ENCOUNTER — Telehealth: Payer: Self-pay | Admitting: Family Medicine

## 2015-06-01 DIAGNOSIS — Z0289 Encounter for other administrative examinations: Secondary | ICD-10-CM

## 2015-06-01 NOTE — Telephone Encounter (Signed)
He's ok my schedule for tomorrow.

## 2015-06-01 NOTE — Telephone Encounter (Signed)
Patient did not come in for their appointment today for follow up.  Please let me know if patient needs to be contacted immediately for follow up or no follow up needed. °

## 2015-06-02 ENCOUNTER — Encounter: Payer: Self-pay | Admitting: Family Medicine

## 2015-06-02 ENCOUNTER — Ambulatory Visit (INDEPENDENT_AMBULATORY_CARE_PROVIDER_SITE_OTHER): Payer: Managed Care, Other (non HMO) | Admitting: Family Medicine

## 2015-06-02 VITALS — BP 132/78 | HR 87 | Temp 97.9°F | Wt 192.8 lb

## 2015-06-02 DIAGNOSIS — E785 Hyperlipidemia, unspecified: Secondary | ICD-10-CM

## 2015-06-02 DIAGNOSIS — F1010 Alcohol abuse, uncomplicated: Secondary | ICD-10-CM | POA: Diagnosis not present

## 2015-06-02 DIAGNOSIS — Z638 Other specified problems related to primary support group: Secondary | ICD-10-CM | POA: Diagnosis not present

## 2015-06-02 MED ORDER — FENOFIBRATE 48 MG PO TABS: 48.0000 mg | ORAL_TABLET | Freq: Every day | ORAL | Status: DC

## 2015-06-02 MED ORDER — LORAZEPAM 1 MG PO TABS: ORAL_TABLET | ORAL | Status: DC

## 2015-06-02 NOTE — Progress Notes (Signed)
Pre visit review using our clinic review tool, if applicable. No additional management support is needed unless otherwise documented below in the visit note. 

## 2015-06-02 NOTE — Assessment & Plan Note (Signed)
Triglycerides deteriorated. >25 minutes spent in face to face time with patient, >50% spent in counselling or coordination of care concerning high triglycerides, alcohol abuse and anxiety. He wants to cut back on drinking (knows to not quit cold [place]) and plans on cutting back with the help of his therapist. Start tricor for now- he is aware of risk of myalgias with lipitor.  Continue lipitor. Follow up labs in 8 weeks. Continue psychotherpay. The patient indicates understanding of these issues and agrees with the plan.

## 2015-06-02 NOTE — Patient Instructions (Signed)
Hang in there.  Please come back for labs in 8 weeks.

## 2015-06-02 NOTE — Progress Notes (Signed)
Subjective:   Patient ID: Brad Cummings, male    DOB: 23-Feb-1951, 64 y.o.   MRN: [id number]  Brad Cummings is a pleasant 64 y.o. year old male who presents to clinic today with Follow-up  on 06/02/2015  HPI:  Hypertriglyceridemia- Taking lipitor 40 mg daily. Increased to 807 this month.  He admits to drinking more ETOH lately- up to 8 per day to deal with recent family stressors.  Daughter still not letting him see his grandson.   Saw the therapist I referred him to which has been helpful.  Therapist advised he go to rehab to ETOH abuse.  Brad Cummings feels he can "stop when he wants to."  "I am just in a self destructive mode."  Wife now wants to leave him.  Denies SI or HI.  Also eating more sweets.  Current Outpatient Prescriptions on File Prior to Visit  Medication Sig Dispense Refill  . acetaminophen (TYLENOL) 500 MG tablet Take 500 mg by mouth every 6 (six) hours as needed.      Marland Kitchen atorvastatin (LIPITOR) 40 MG tablet TAKE 1 TABLET EVERY DAY 30 tablet 0  . escitalopram (LEXAPRO) 20 MG tablet Take 1 tablet (20 mg total) by mouth daily. 90 tablet 3  . fluticasone (FLONASE) 50 MCG/ACT nasal spray USE 2 SPRAYS IN EACH NOSTRIL EVERY DAY 16 g 5  . lisinopril-hydrochlorothiazide (PRINZIDE,ZESTORETIC) 20-12.5 MG per tablet TAKE ONE TABLET EVERY MORNING 30 tablet 4  . Probiotic Product (PROBIOTIC DAILY PO) Take 1 capsule by mouth daily.     No current facility-administered medications on file prior to visit.    Allergies  Allergen Reactions  . Penicillins     Past Medical History  Diagnosis Date  . HLD (hyperlipidemia)   . HTN (hypertension)   . Ventral hernia   . Hx of colonic polyps   . Anxiety   . Staph infection   . Colon polyp     Past Surgical History  Procedure Laterality Date  . Laparoscopy      umbilical hernia  . Knee scope Left 1989  . Colonoscopy w/ biopsies    . Umbilcal hernia repair N/A     Family History  Problem Relation Age of Onset  . Colon polyps  Brother   . Colon polyps Father   . Colon cancer Brother 6    removed a foot of his colon  . Diabetes Father   . Heart disease Father   . Heart disease Brother     History   Social History  . Marital Status: Married    Spouse Name: N/A  . Number of Children: 1  . Years of Education: N/A   Occupational History  . 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 Smoking status: Never Smoker   . Smokeless tobacco: Never Used  . Alcohol Use: 10.8 oz/week    0 Standard drinks or equivalent, 10 Glasses of wine, 8 Cans of beer per week     Comment: 5 beers a day/ or 3 glasses of wine  . Drug Use: No  . Sexual Activity: Not on file   Other Topics Concern  . Not on file   Social History Narrative   The PMH, PSH, Social History, Family History, Medications, and allergies have been reviewed in [facility], and have been updated if relevant.     Review of Systems  Constitutional: Negative.   Musculoskeletal: Negative.   Skin: Negative.  Psychiatric/Behavioral: Positive for self-injury and dysphoric mood. Negative for suicidal ideas. The patient is not nervous/anxious.   All other systems reviewed and are negative.      Objective:    BP 132/78 mmHg  Pulse 87  Temp(Src) 97.9 F (36.6 C) (Oral)  Wt 192 lb 12 oz (87.431 kg)  SpO2 95%   Physical Exam  Constitutional: He is oriented to person, place, and time. He appears well-developed and well-nourished. No distress.  HENT:  Head: Normocephalic.  Eyes: Conjunctivae are normal.  Cardiovascular: Normal rate.   Pulmonary/Chest: Effort normal.  Musculoskeletal: Normal range of motion.  Neurological: He is alert and oriented to person, place, and time. No cranial nerve deficit.  Skin: Skin is warm and dry.  Psychiatric: He has a normal mood and affect. His behavior is normal. Judgment and thought content normal.          Assessment & Plan:   HLD (hyperlipidemia)  Alcohol abuse  Stress due to  family tension No Follow-up on file.

## 2015-06-06 ENCOUNTER — Other Ambulatory Visit: Payer: Self-pay | Admitting: Family Medicine

## 2015-06-13 ENCOUNTER — Encounter: Payer: Self-pay | Admitting: Gastroenterology

## 2015-06-24 ENCOUNTER — Telehealth: Payer: Self-pay

## 2015-06-24 NOTE — Telephone Encounter (Signed)
pt cursed me out and told me to cancel his appt when i call him to remind him of his appt on monday with tina.  ( you referred pt to tine on 05-24-15 appt)

## 2015-06-27 ENCOUNTER — Ambulatory Visit: Payer: Managed Care, Other (non HMO) | Admitting: Licensed Clinical Social Worker

## 2015-06-27 ENCOUNTER — Other Ambulatory Visit: Payer: Self-pay

## 2015-07-01 ENCOUNTER — Other Ambulatory Visit: Payer: Self-pay | Admitting: Family Medicine

## 2015-07-01 NOTE — Telephone Encounter (Signed)
Last f/u appt 06/2015 

## 2015-07-01 NOTE — Telephone Encounter (Signed)
I will give him enough to get him through until Dr. Deborra Medina gets back, I am just concerned about this amount of Ativan with the amount of alcohol he is drinking. Please phone in Ativan

## 2015-07-01 NOTE — Telephone Encounter (Signed)
Rx called into requested pharmacy. Lm on pts vm and advised per [facility]

## 2015-07-05 ENCOUNTER — Telehealth: Payer: Self-pay | Admitting: Family Medicine

## 2015-07-05 NOTE — Telephone Encounter (Signed)
Pt would like return phone call regarding his lorazapam.  Pt cb number [phone]

## 2015-07-05 NOTE — Telephone Encounter (Signed)
Lm on pts vm requesting a call back 

## 2015-07-07 ENCOUNTER — Other Ambulatory Visit: Payer: Self-pay | Admitting: Family Medicine

## 2015-07-07 NOTE — Telephone Encounter (Signed)
See additional phone note for refill request

## 2015-07-08 NOTE — Telephone Encounter (Signed)
After reviewing chart, agreed that we need to decrease amount of ativan he is getting.  I referred him to a psychiatrist who is able to take over prescribing rxs as he or she sees fit.  Please only refill 60 instead of 90 tablets, no refills.  When is his appt with psychiatry?

## 2015-07-11 NOTE — Telephone Encounter (Signed)
Patient notified as instructed by telephone and verbalized understanding. Was advised by patient that he only saw a psychiatrist once and was told that none of his problems are his fault and he is not scheduled to see them again. Patient stated that Dr. Deborra Medina needs to continue his medications as always.

## 2015-07-12 NOTE — Telephone Encounter (Signed)
Pt is concerned about future refills of lorazepam;pt said that he has only seen psychiatrist one time and is not scheduled to see psychiatrist again and pt request Dr Deborra Medina continue to refill lorazepam to [facility]. Last refill was # 30 with instructions 1 tab tid if needed for anxiety. Pt request next refill of lorazepam be for # 90. Pt request cb.

## 2015-07-13 ENCOUNTER — Encounter: Payer: Self-pay | Admitting: Family Medicine

## 2015-07-13 ENCOUNTER — Ambulatory Visit (INDEPENDENT_AMBULATORY_CARE_PROVIDER_SITE_OTHER): Payer: Managed Care, Other (non HMO) | Admitting: Family Medicine

## 2015-07-13 VITALS — BP 144/82 | HR 102 | Temp 98.1°F | Wt 188.8 lb

## 2015-07-13 DIAGNOSIS — F411 Generalized anxiety disorder: Secondary | ICD-10-CM

## 2015-07-13 DIAGNOSIS — F1010 Alcohol abuse, uncomplicated: Secondary | ICD-10-CM

## 2015-07-13 MED ORDER — LORAZEPAM 1 MG PO TABS: ORAL_TABLET | ORAL | Status: DC

## 2015-07-13 NOTE — Progress Notes (Signed)
Pre visit review using our clinic review tool, if applicable. No additional management support is needed unless otherwise documented below in the visit note. 

## 2015-07-13 NOTE — Assessment & Plan Note (Signed)
>  25 minutes spent in face to face time with patient, >50% spent in counselling or coordination of care concerning his anxiety and ETOH abuse.  He is refusing psychiatry and explained to him why it is dangerous to self medicate with substances and alcohol especially while taking a benzodiazepine.  I suggested he see an addiction specialist, consider addiction rx but he is refusing.  He does need ativan now- would not stop him cold [place] and he has severe anxiety.  I agreed to refill it but he will need to leave a urine sample randomly for me for me to continue to prescribe. The patient indicates understanding of these issues and agrees with the plan.
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
## 2015-07-21 ENCOUNTER — Other Ambulatory Visit: Payer: Self-pay | Admitting: Family Medicine

## 2015-08-20 ENCOUNTER — Other Ambulatory Visit: Payer: Self-pay | Admitting: Family Medicine

## 2015-08-23 ENCOUNTER — Encounter: Payer: Managed Care, Other (non HMO) | Admitting: Gastroenterology

## 2015-10-06 ENCOUNTER — Other Ambulatory Visit: Payer: Self-pay | Admitting: *Deleted

## 2015-10-06 MED ORDER — ATORVASTATIN CALCIUM 40 MG PO TABS: 40.0000 mg | ORAL_TABLET | Freq: Every day | ORAL | Status: AC

## 2015-10-06 MED ORDER — FENOFIBRATE 48 MG PO TABS: 48.0000 mg | ORAL_TABLET | Freq: Every day | ORAL | Status: DC

## 2016-01-18 DIAGNOSIS — M5134 Other intervertebral disc degeneration, thoracic region: Secondary | ICD-10-CM | POA: Diagnosis not present

## 2016-01-18 DIAGNOSIS — M9902 Segmental and somatic dysfunction of thoracic region: Secondary | ICD-10-CM | POA: Diagnosis not present

## 2016-01-18 DIAGNOSIS — M9901 Segmental and somatic dysfunction of cervical region: Secondary | ICD-10-CM | POA: Diagnosis not present

## 2016-01-18 DIAGNOSIS — R51 Headache: Secondary | ICD-10-CM | POA: Diagnosis not present

## 2016-01-23 DIAGNOSIS — M9901 Segmental and somatic dysfunction of cervical region: Secondary | ICD-10-CM | POA: Diagnosis not present

## 2016-01-23 DIAGNOSIS — R51 Headache: Secondary | ICD-10-CM | POA: Diagnosis not present

## 2016-01-23 DIAGNOSIS — M9902 Segmental and somatic dysfunction of thoracic region: Secondary | ICD-10-CM | POA: Diagnosis not present

## 2016-01-23 DIAGNOSIS — M5134 Other intervertebral disc degeneration, thoracic region: Secondary | ICD-10-CM | POA: Diagnosis not present

## 2016-02-06 DIAGNOSIS — M9901 Segmental and somatic dysfunction of cervical region: Secondary | ICD-10-CM | POA: Diagnosis not present

## 2016-02-06 DIAGNOSIS — E785 Hyperlipidemia, unspecified: Secondary | ICD-10-CM | POA: Diagnosis not present

## 2016-02-06 DIAGNOSIS — R5383 Other fatigue: Secondary | ICD-10-CM | POA: Diagnosis not present

## 2016-02-06 DIAGNOSIS — I10 Essential (primary) hypertension: Secondary | ICD-10-CM | POA: Diagnosis not present

## 2016-02-06 DIAGNOSIS — E7800 Pure hypercholesterolemia, unspecified: Secondary | ICD-10-CM | POA: Diagnosis not present

## 2016-02-06 DIAGNOSIS — Z79899 Other long term (current) drug therapy: Secondary | ICD-10-CM | POA: Diagnosis not present

## 2016-02-06 DIAGNOSIS — F329 Major depressive disorder, single episode, unspecified: Secondary | ICD-10-CM | POA: Diagnosis not present

## 2016-02-06 DIAGNOSIS — R51 Headache: Secondary | ICD-10-CM | POA: Diagnosis not present

## 2016-02-06 DIAGNOSIS — M5134 Other intervertebral disc degeneration, thoracic region: Secondary | ICD-10-CM | POA: Diagnosis not present

## 2016-02-06 DIAGNOSIS — F419 Anxiety disorder, unspecified: Secondary | ICD-10-CM | POA: Diagnosis not present

## 2016-02-06 DIAGNOSIS — M9902 Segmental and somatic dysfunction of thoracic region: Secondary | ICD-10-CM | POA: Diagnosis not present

## 2016-02-06 DIAGNOSIS — E559 Vitamin D deficiency, unspecified: Secondary | ICD-10-CM | POA: Diagnosis not present

## 2016-02-07 DIAGNOSIS — Z79891 Long term (current) use of opiate analgesic: Secondary | ICD-10-CM | POA: Diagnosis not present

## 2016-02-13 DIAGNOSIS — M9901 Segmental and somatic dysfunction of cervical region: Secondary | ICD-10-CM | POA: Diagnosis not present

## 2016-02-13 DIAGNOSIS — M9902 Segmental and somatic dysfunction of thoracic region: Secondary | ICD-10-CM | POA: Diagnosis not present

## 2016-02-13 DIAGNOSIS — M5134 Other intervertebral disc degeneration, thoracic region: Secondary | ICD-10-CM | POA: Diagnosis not present

## 2016-02-13 DIAGNOSIS — R51 Headache: Secondary | ICD-10-CM | POA: Diagnosis not present

## 2016-03-05 DIAGNOSIS — N486 Induration penis plastica: Secondary | ICD-10-CM | POA: Diagnosis not present

## 2016-03-05 DIAGNOSIS — R51 Headache: Secondary | ICD-10-CM | POA: Diagnosis not present

## 2016-03-05 DIAGNOSIS — M9902 Segmental and somatic dysfunction of thoracic region: Secondary | ICD-10-CM | POA: Diagnosis not present

## 2016-03-05 DIAGNOSIS — M9901 Segmental and somatic dysfunction of cervical region: Secondary | ICD-10-CM | POA: Diagnosis not present

## 2016-03-05 DIAGNOSIS — E291 Testicular hypofunction: Secondary | ICD-10-CM | POA: Diagnosis not present

## 2016-03-05 DIAGNOSIS — M5134 Other intervertebral disc degeneration, thoracic region: Secondary | ICD-10-CM | POA: Diagnosis not present

## 2016-03-12 DIAGNOSIS — E291 Testicular hypofunction: Secondary | ICD-10-CM | POA: Diagnosis not present

## 2016-03-14 DIAGNOSIS — M5134 Other intervertebral disc degeneration, thoracic region: Secondary | ICD-10-CM | POA: Diagnosis not present

## 2016-03-14 DIAGNOSIS — R51 Headache: Secondary | ICD-10-CM | POA: Diagnosis not present

## 2016-03-14 DIAGNOSIS — M9902 Segmental and somatic dysfunction of thoracic region: Secondary | ICD-10-CM | POA: Diagnosis not present

## 2016-03-14 DIAGNOSIS — M9901 Segmental and somatic dysfunction of cervical region: Secondary | ICD-10-CM | POA: Diagnosis not present

## 2016-03-21 DIAGNOSIS — M9901 Segmental and somatic dysfunction of cervical region: Secondary | ICD-10-CM | POA: Diagnosis not present

## 2016-03-21 DIAGNOSIS — M5134 Other intervertebral disc degeneration, thoracic region: Secondary | ICD-10-CM | POA: Diagnosis not present

## 2016-03-21 DIAGNOSIS — R51 Headache: Secondary | ICD-10-CM | POA: Diagnosis not present

## 2016-03-21 DIAGNOSIS — M9902 Segmental and somatic dysfunction of thoracic region: Secondary | ICD-10-CM | POA: Diagnosis not present

## 2016-04-18 DIAGNOSIS — M9901 Segmental and somatic dysfunction of cervical region: Secondary | ICD-10-CM | POA: Diagnosis not present

## 2016-04-18 DIAGNOSIS — M5134 Other intervertebral disc degeneration, thoracic region: Secondary | ICD-10-CM | POA: Diagnosis not present

## 2016-04-18 DIAGNOSIS — R51 Headache: Secondary | ICD-10-CM | POA: Diagnosis not present

## 2016-04-18 DIAGNOSIS — M9902 Segmental and somatic dysfunction of thoracic region: Secondary | ICD-10-CM | POA: Diagnosis not present

## 2016-04-27 DIAGNOSIS — X32XXXA Exposure to sunlight, initial encounter: Secondary | ICD-10-CM | POA: Diagnosis not present

## 2016-04-27 DIAGNOSIS — C44329 Squamous cell carcinoma of skin of other parts of face: Secondary | ICD-10-CM | POA: Diagnosis not present

## 2016-04-27 DIAGNOSIS — R202 Paresthesia of skin: Secondary | ICD-10-CM | POA: Diagnosis not present

## 2016-04-27 DIAGNOSIS — D485 Neoplasm of uncertain behavior of skin: Secondary | ICD-10-CM | POA: Diagnosis not present

## 2016-04-27 DIAGNOSIS — L570 Actinic keratosis: Secondary | ICD-10-CM | POA: Diagnosis not present

## 2016-05-08 DIAGNOSIS — R5383 Other fatigue: Secondary | ICD-10-CM | POA: Diagnosis not present

## 2016-05-08 DIAGNOSIS — C4492 Squamous cell carcinoma of skin, unspecified: Secondary | ICD-10-CM | POA: Diagnosis not present

## 2016-05-08 DIAGNOSIS — E7800 Pure hypercholesterolemia, unspecified: Secondary | ICD-10-CM | POA: Diagnosis not present

## 2016-05-08 DIAGNOSIS — N400 Enlarged prostate without lower urinary tract symptoms: Secondary | ICD-10-CM | POA: Diagnosis not present

## 2016-05-08 DIAGNOSIS — I10 Essential (primary) hypertension: Secondary | ICD-10-CM | POA: Diagnosis not present

## 2016-05-08 DIAGNOSIS — F419 Anxiety disorder, unspecified: Secondary | ICD-10-CM | POA: Diagnosis not present

## 2016-05-08 DIAGNOSIS — L309 Dermatitis, unspecified: Secondary | ICD-10-CM | POA: Diagnosis not present

## 2016-05-08 DIAGNOSIS — E559 Vitamin D deficiency, unspecified: Secondary | ICD-10-CM | POA: Diagnosis not present

## 2016-05-08 DIAGNOSIS — E785 Hyperlipidemia, unspecified: Secondary | ICD-10-CM | POA: Diagnosis not present

## 2016-05-29 DIAGNOSIS — L239 Allergic contact dermatitis, unspecified cause: Secondary | ICD-10-CM | POA: Diagnosis not present

## 2016-05-31 DIAGNOSIS — Z85828 Personal history of other malignant neoplasm of skin: Secondary | ICD-10-CM | POA: Diagnosis not present

## 2016-05-31 DIAGNOSIS — C44329 Squamous cell carcinoma of skin of other parts of face: Secondary | ICD-10-CM | POA: Diagnosis not present

## 2016-08-07 DIAGNOSIS — E785 Hyperlipidemia, unspecified: Secondary | ICD-10-CM | POA: Diagnosis not present

## 2016-08-07 DIAGNOSIS — F419 Anxiety disorder, unspecified: Secondary | ICD-10-CM | POA: Diagnosis not present

## 2016-08-07 DIAGNOSIS — I10 Essential (primary) hypertension: Secondary | ICD-10-CM | POA: Diagnosis not present

## 2016-08-07 DIAGNOSIS — Z79899 Other long term (current) drug therapy: Secondary | ICD-10-CM | POA: Diagnosis not present

## 2016-08-07 DIAGNOSIS — E559 Vitamin D deficiency, unspecified: Secondary | ICD-10-CM | POA: Diagnosis not present

## 2016-08-08 DIAGNOSIS — Z79891 Long term (current) use of opiate analgesic: Secondary | ICD-10-CM | POA: Diagnosis not present

## 2016-08-20 DIAGNOSIS — R51 Headache: Secondary | ICD-10-CM | POA: Diagnosis not present

## 2016-08-20 DIAGNOSIS — M5134 Other intervertebral disc degeneration, thoracic region: Secondary | ICD-10-CM | POA: Diagnosis not present

## 2016-08-20 DIAGNOSIS — M9902 Segmental and somatic dysfunction of thoracic region: Secondary | ICD-10-CM | POA: Diagnosis not present

## 2016-08-20 DIAGNOSIS — M9901 Segmental and somatic dysfunction of cervical region: Secondary | ICD-10-CM | POA: Diagnosis not present

## 2016-09-18 DIAGNOSIS — R51 Headache: Secondary | ICD-10-CM | POA: Diagnosis not present

## 2016-09-18 DIAGNOSIS — M5134 Other intervertebral disc degeneration, thoracic region: Secondary | ICD-10-CM | POA: Diagnosis not present

## 2016-09-18 DIAGNOSIS — M9901 Segmental and somatic dysfunction of cervical region: Secondary | ICD-10-CM | POA: Diagnosis not present

## 2016-09-18 DIAGNOSIS — M9902 Segmental and somatic dysfunction of thoracic region: Secondary | ICD-10-CM | POA: Diagnosis not present

## 2016-09-25 DIAGNOSIS — M9901 Segmental and somatic dysfunction of cervical region: Secondary | ICD-10-CM | POA: Diagnosis not present

## 2016-09-25 DIAGNOSIS — M5134 Other intervertebral disc degeneration, thoracic region: Secondary | ICD-10-CM | POA: Diagnosis not present

## 2016-09-25 DIAGNOSIS — M9902 Segmental and somatic dysfunction of thoracic region: Secondary | ICD-10-CM | POA: Diagnosis not present

## 2016-09-25 DIAGNOSIS — R51 Headache: Secondary | ICD-10-CM | POA: Diagnosis not present

## 2016-10-09 DIAGNOSIS — M9902 Segmental and somatic dysfunction of thoracic region: Secondary | ICD-10-CM | POA: Diagnosis not present

## 2016-10-09 DIAGNOSIS — M9901 Segmental and somatic dysfunction of cervical region: Secondary | ICD-10-CM | POA: Diagnosis not present

## 2016-10-09 DIAGNOSIS — M5134 Other intervertebral disc degeneration, thoracic region: Secondary | ICD-10-CM | POA: Diagnosis not present

## 2016-10-09 DIAGNOSIS — R51 Headache: Secondary | ICD-10-CM | POA: Diagnosis not present

## 2016-10-18 DIAGNOSIS — E291 Testicular hypofunction: Secondary | ICD-10-CM | POA: Diagnosis not present

## 2016-10-18 DIAGNOSIS — N486 Induration penis plastica: Secondary | ICD-10-CM | POA: Diagnosis not present

## 2016-10-18 DIAGNOSIS — N5201 Erectile dysfunction due to arterial insufficiency: Secondary | ICD-10-CM | POA: Diagnosis not present

## 2016-10-18 DIAGNOSIS — Z79899 Other long term (current) drug therapy: Secondary | ICD-10-CM | POA: Diagnosis not present

## 2016-10-18 DIAGNOSIS — N401 Enlarged prostate with lower urinary tract symptoms: Secondary | ICD-10-CM | POA: Diagnosis not present

## 2016-10-24 DIAGNOSIS — N5201 Erectile dysfunction due to arterial insufficiency: Secondary | ICD-10-CM | POA: Diagnosis not present

## 2016-10-24 DIAGNOSIS — N486 Induration penis plastica: Secondary | ICD-10-CM | POA: Diagnosis not present

## 2016-10-24 DIAGNOSIS — R3915 Urgency of urination: Secondary | ICD-10-CM | POA: Diagnosis not present

## 2016-10-24 DIAGNOSIS — R350 Frequency of micturition: Secondary | ICD-10-CM | POA: Diagnosis not present

## 2016-10-24 DIAGNOSIS — E291 Testicular hypofunction: Secondary | ICD-10-CM | POA: Diagnosis not present

## 2016-10-24 DIAGNOSIS — N401 Enlarged prostate with lower urinary tract symptoms: Secondary | ICD-10-CM | POA: Diagnosis not present

## 2016-11-06 DIAGNOSIS — E785 Hyperlipidemia, unspecified: Secondary | ICD-10-CM | POA: Diagnosis not present

## 2016-11-06 DIAGNOSIS — M9901 Segmental and somatic dysfunction of cervical region: Secondary | ICD-10-CM | POA: Diagnosis not present

## 2016-11-06 DIAGNOSIS — F419 Anxiety disorder, unspecified: Secondary | ICD-10-CM | POA: Diagnosis not present

## 2016-11-06 DIAGNOSIS — M5134 Other intervertebral disc degeneration, thoracic region: Secondary | ICD-10-CM | POA: Diagnosis not present

## 2016-11-06 DIAGNOSIS — M9902 Segmental and somatic dysfunction of thoracic region: Secondary | ICD-10-CM | POA: Diagnosis not present

## 2016-11-06 DIAGNOSIS — R51 Headache: Secondary | ICD-10-CM | POA: Diagnosis not present

## 2016-11-26 DIAGNOSIS — M5134 Other intervertebral disc degeneration, thoracic region: Secondary | ICD-10-CM | POA: Diagnosis not present

## 2016-11-26 DIAGNOSIS — M9901 Segmental and somatic dysfunction of cervical region: Secondary | ICD-10-CM | POA: Diagnosis not present

## 2016-11-26 DIAGNOSIS — M9902 Segmental and somatic dysfunction of thoracic region: Secondary | ICD-10-CM | POA: Diagnosis not present

## 2016-11-26 DIAGNOSIS — R51 Headache: Secondary | ICD-10-CM | POA: Diagnosis not present

## 2016-12-11 DIAGNOSIS — M9901 Segmental and somatic dysfunction of cervical region: Secondary | ICD-10-CM | POA: Diagnosis not present

## 2016-12-11 DIAGNOSIS — R51 Headache: Secondary | ICD-10-CM | POA: Diagnosis not present

## 2016-12-11 DIAGNOSIS — M9902 Segmental and somatic dysfunction of thoracic region: Secondary | ICD-10-CM | POA: Diagnosis not present

## 2016-12-11 DIAGNOSIS — M5134 Other intervertebral disc degeneration, thoracic region: Secondary | ICD-10-CM | POA: Diagnosis not present

## 2017-02-11 DIAGNOSIS — E559 Vitamin D deficiency, unspecified: Secondary | ICD-10-CM | POA: Diagnosis not present

## 2017-02-11 DIAGNOSIS — I10 Essential (primary) hypertension: Secondary | ICD-10-CM | POA: Diagnosis not present

## 2017-02-11 DIAGNOSIS — Z79899 Other long term (current) drug therapy: Secondary | ICD-10-CM | POA: Diagnosis not present

## 2017-02-11 DIAGNOSIS — F419 Anxiety disorder, unspecified: Secondary | ICD-10-CM | POA: Diagnosis not present

## 2017-02-11 DIAGNOSIS — J019 Acute sinusitis, unspecified: Secondary | ICD-10-CM | POA: Diagnosis not present

## 2017-02-25 DIAGNOSIS — M5134 Other intervertebral disc degeneration, thoracic region: Secondary | ICD-10-CM | POA: Diagnosis not present

## 2017-02-25 DIAGNOSIS — M9902 Segmental and somatic dysfunction of thoracic region: Secondary | ICD-10-CM | POA: Diagnosis not present

## 2017-02-25 DIAGNOSIS — R51 Headache: Secondary | ICD-10-CM | POA: Diagnosis not present

## 2017-02-25 DIAGNOSIS — M9901 Segmental and somatic dysfunction of cervical region: Secondary | ICD-10-CM | POA: Diagnosis not present

## 2017-02-26 DIAGNOSIS — R51 Headache: Secondary | ICD-10-CM | POA: Diagnosis not present

## 2017-02-26 DIAGNOSIS — M9901 Segmental and somatic dysfunction of cervical region: Secondary | ICD-10-CM | POA: Diagnosis not present

## 2017-02-26 DIAGNOSIS — M5134 Other intervertebral disc degeneration, thoracic region: Secondary | ICD-10-CM | POA: Diagnosis not present

## 2017-02-26 DIAGNOSIS — M9902 Segmental and somatic dysfunction of thoracic region: Secondary | ICD-10-CM | POA: Diagnosis not present

## 2017-03-05 DIAGNOSIS — M5134 Other intervertebral disc degeneration, thoracic region: Secondary | ICD-10-CM | POA: Diagnosis not present

## 2017-03-05 DIAGNOSIS — M9902 Segmental and somatic dysfunction of thoracic region: Secondary | ICD-10-CM | POA: Diagnosis not present

## 2017-03-05 DIAGNOSIS — M9901 Segmental and somatic dysfunction of cervical region: Secondary | ICD-10-CM | POA: Diagnosis not present

## 2017-03-05 DIAGNOSIS — R51 Headache: Secondary | ICD-10-CM | POA: Diagnosis not present

## 2017-03-20 DIAGNOSIS — M5134 Other intervertebral disc degeneration, thoracic region: Secondary | ICD-10-CM | POA: Diagnosis not present

## 2017-03-20 DIAGNOSIS — M9902 Segmental and somatic dysfunction of thoracic region: Secondary | ICD-10-CM | POA: Diagnosis not present

## 2017-03-20 DIAGNOSIS — R51 Headache: Secondary | ICD-10-CM | POA: Diagnosis not present

## 2017-03-20 DIAGNOSIS — M9901 Segmental and somatic dysfunction of cervical region: Secondary | ICD-10-CM | POA: Diagnosis not present

## 2017-04-17 DIAGNOSIS — N401 Enlarged prostate with lower urinary tract symptoms: Secondary | ICD-10-CM | POA: Diagnosis not present

## 2017-04-17 DIAGNOSIS — N486 Induration penis plastica: Secondary | ICD-10-CM | POA: Diagnosis not present

## 2017-04-17 DIAGNOSIS — Z79899 Other long term (current) drug therapy: Secondary | ICD-10-CM | POA: Diagnosis not present

## 2017-04-17 DIAGNOSIS — E291 Testicular hypofunction: Secondary | ICD-10-CM | POA: Diagnosis not present

## 2017-04-17 DIAGNOSIS — N5201 Erectile dysfunction due to arterial insufficiency: Secondary | ICD-10-CM | POA: Diagnosis not present

## 2017-05-09 DIAGNOSIS — R784 Finding of other drugs of addictive potential in blood: Secondary | ICD-10-CM | POA: Diagnosis not present

## 2017-05-09 DIAGNOSIS — E559 Vitamin D deficiency, unspecified: Secondary | ICD-10-CM | POA: Diagnosis not present

## 2017-05-09 DIAGNOSIS — E291 Testicular hypofunction: Secondary | ICD-10-CM | POA: Diagnosis not present

## 2017-05-09 DIAGNOSIS — I10 Essential (primary) hypertension: Secondary | ICD-10-CM | POA: Diagnosis not present

## 2017-05-09 DIAGNOSIS — F419 Anxiety disorder, unspecified: Secondary | ICD-10-CM | POA: Diagnosis not present

## 2017-05-09 DIAGNOSIS — J302 Other seasonal allergic rhinitis: Secondary | ICD-10-CM | POA: Diagnosis not present

## 2017-05-09 DIAGNOSIS — N400 Enlarged prostate without lower urinary tract symptoms: Secondary | ICD-10-CM | POA: Diagnosis not present

## 2017-05-09 DIAGNOSIS — E7800 Pure hypercholesterolemia, unspecified: Secondary | ICD-10-CM | POA: Diagnosis not present

## 2017-05-13 DIAGNOSIS — M5134 Other intervertebral disc degeneration, thoracic region: Secondary | ICD-10-CM | POA: Diagnosis not present

## 2017-05-13 DIAGNOSIS — M9901 Segmental and somatic dysfunction of cervical region: Secondary | ICD-10-CM | POA: Diagnosis not present

## 2017-05-13 DIAGNOSIS — R51 Headache: Secondary | ICD-10-CM | POA: Diagnosis not present

## 2017-05-13 DIAGNOSIS — M9902 Segmental and somatic dysfunction of thoracic region: Secondary | ICD-10-CM | POA: Diagnosis not present

## 2017-06-11 DIAGNOSIS — R51 Headache: Secondary | ICD-10-CM | POA: Diagnosis not present

## 2017-06-11 DIAGNOSIS — M9901 Segmental and somatic dysfunction of cervical region: Secondary | ICD-10-CM | POA: Diagnosis not present

## 2017-06-11 DIAGNOSIS — M9902 Segmental and somatic dysfunction of thoracic region: Secondary | ICD-10-CM | POA: Diagnosis not present

## 2017-06-11 DIAGNOSIS — M5134 Other intervertebral disc degeneration, thoracic region: Secondary | ICD-10-CM | POA: Diagnosis not present

## 2017-08-22 DIAGNOSIS — I10 Essential (primary) hypertension: Secondary | ICD-10-CM | POA: Diagnosis not present

## 2017-08-22 DIAGNOSIS — Z7689 Persons encountering health services in other specified circumstances: Secondary | ICD-10-CM | POA: Diagnosis not present

## 2017-08-22 DIAGNOSIS — J302 Other seasonal allergic rhinitis: Secondary | ICD-10-CM | POA: Diagnosis not present

## 2017-08-22 DIAGNOSIS — E559 Vitamin D deficiency, unspecified: Secondary | ICD-10-CM | POA: Diagnosis not present

## 2017-08-22 DIAGNOSIS — F419 Anxiety disorder, unspecified: Secondary | ICD-10-CM | POA: Diagnosis not present

## 2017-08-22 DIAGNOSIS — Z79899 Other long term (current) drug therapy: Secondary | ICD-10-CM | POA: Diagnosis not present

## 2017-08-22 DIAGNOSIS — R5383 Other fatigue: Secondary | ICD-10-CM | POA: Diagnosis not present

## 2017-08-22 DIAGNOSIS — E7800 Pure hypercholesterolemia, unspecified: Secondary | ICD-10-CM | POA: Diagnosis not present

## 2017-10-08 DIAGNOSIS — F419 Anxiety disorder, unspecified: Secondary | ICD-10-CM | POA: Diagnosis not present

## 2017-10-08 DIAGNOSIS — F339 Major depressive disorder, recurrent, unspecified: Secondary | ICD-10-CM | POA: Diagnosis not present

## 2017-10-08 DIAGNOSIS — E291 Testicular hypofunction: Secondary | ICD-10-CM | POA: Diagnosis not present

## 2017-10-08 DIAGNOSIS — Z23 Encounter for immunization: Secondary | ICD-10-CM | POA: Diagnosis not present

## 2017-10-15 DIAGNOSIS — E291 Testicular hypofunction: Secondary | ICD-10-CM | POA: Diagnosis not present

## 2017-10-15 DIAGNOSIS — N5201 Erectile dysfunction due to arterial insufficiency: Secondary | ICD-10-CM | POA: Diagnosis not present

## 2017-10-15 DIAGNOSIS — R972 Elevated prostate specific antigen [PSA]: Secondary | ICD-10-CM | POA: Diagnosis not present

## 2017-10-15 DIAGNOSIS — Z79899 Other long term (current) drug therapy: Secondary | ICD-10-CM | POA: Diagnosis not present

## 2017-10-15 DIAGNOSIS — N401 Enlarged prostate with lower urinary tract symptoms: Secondary | ICD-10-CM | POA: Diagnosis not present

## 2017-10-15 DIAGNOSIS — N486 Induration penis plastica: Secondary | ICD-10-CM | POA: Diagnosis not present

## 2017-11-04 DIAGNOSIS — R51 Headache: Secondary | ICD-10-CM | POA: Diagnosis not present

## 2017-11-04 DIAGNOSIS — M5136 Other intervertebral disc degeneration, lumbar region: Secondary | ICD-10-CM | POA: Diagnosis not present

## 2017-11-04 DIAGNOSIS — M9903 Segmental and somatic dysfunction of lumbar region: Secondary | ICD-10-CM | POA: Diagnosis not present

## 2017-11-04 DIAGNOSIS — M9901 Segmental and somatic dysfunction of cervical region: Secondary | ICD-10-CM | POA: Diagnosis not present

## 2017-11-11 DIAGNOSIS — R51 Headache: Secondary | ICD-10-CM | POA: Diagnosis not present

## 2017-11-11 DIAGNOSIS — M9901 Segmental and somatic dysfunction of cervical region: Secondary | ICD-10-CM | POA: Diagnosis not present

## 2017-11-11 DIAGNOSIS — M5136 Other intervertebral disc degeneration, lumbar region: Secondary | ICD-10-CM | POA: Diagnosis not present

## 2017-11-11 DIAGNOSIS — M9903 Segmental and somatic dysfunction of lumbar region: Secondary | ICD-10-CM | POA: Diagnosis not present

## 2017-11-25 DIAGNOSIS — R51 Headache: Secondary | ICD-10-CM | POA: Diagnosis not present

## 2017-11-25 DIAGNOSIS — M9903 Segmental and somatic dysfunction of lumbar region: Secondary | ICD-10-CM | POA: Diagnosis not present

## 2017-11-25 DIAGNOSIS — M5136 Other intervertebral disc degeneration, lumbar region: Secondary | ICD-10-CM | POA: Diagnosis not present

## 2017-11-25 DIAGNOSIS — M9901 Segmental and somatic dysfunction of cervical region: Secondary | ICD-10-CM | POA: Diagnosis not present

## 2017-12-04 DIAGNOSIS — R51 Headache: Secondary | ICD-10-CM | POA: Diagnosis not present

## 2017-12-04 DIAGNOSIS — M5136 Other intervertebral disc degeneration, lumbar region: Secondary | ICD-10-CM | POA: Diagnosis not present

## 2017-12-04 DIAGNOSIS — M9901 Segmental and somatic dysfunction of cervical region: Secondary | ICD-10-CM | POA: Diagnosis not present

## 2017-12-04 DIAGNOSIS — M9903 Segmental and somatic dysfunction of lumbar region: Secondary | ICD-10-CM | POA: Diagnosis not present

## 2018-03-04 DIAGNOSIS — I10 Essential (primary) hypertension: Secondary | ICD-10-CM | POA: Diagnosis not present

## 2018-03-04 DIAGNOSIS — Z79899 Other long term (current) drug therapy: Secondary | ICD-10-CM | POA: Diagnosis not present

## 2018-03-04 DIAGNOSIS — Z118 Encounter for screening for other infectious and parasitic diseases: Secondary | ICD-10-CM | POA: Diagnosis not present

## 2018-03-04 DIAGNOSIS — E291 Testicular hypofunction: Secondary | ICD-10-CM | POA: Diagnosis not present

## 2018-03-04 DIAGNOSIS — R300 Dysuria: Secondary | ICD-10-CM | POA: Diagnosis not present

## 2018-03-04 DIAGNOSIS — F419 Anxiety disorder, unspecified: Secondary | ICD-10-CM | POA: Diagnosis not present

## 2018-03-04 DIAGNOSIS — F339 Major depressive disorder, recurrent, unspecified: Secondary | ICD-10-CM | POA: Diagnosis not present

## 2018-04-01 DIAGNOSIS — L2089 Other atopic dermatitis: Secondary | ICD-10-CM | POA: Diagnosis not present

## 2018-04-01 DIAGNOSIS — L0101 Non-bullous impetigo: Secondary | ICD-10-CM | POA: Diagnosis not present

## 2018-04-14 DIAGNOSIS — R972 Elevated prostate specific antigen [PSA]: Secondary | ICD-10-CM | POA: Diagnosis not present

## 2018-04-14 DIAGNOSIS — N486 Induration penis plastica: Secondary | ICD-10-CM | POA: Diagnosis not present

## 2018-04-14 DIAGNOSIS — E291 Testicular hypofunction: Secondary | ICD-10-CM | POA: Diagnosis not present

## 2018-04-14 DIAGNOSIS — N401 Enlarged prostate with lower urinary tract symptoms: Secondary | ICD-10-CM | POA: Diagnosis not present

## 2018-04-14 DIAGNOSIS — N5201 Erectile dysfunction due to arterial insufficiency: Secondary | ICD-10-CM | POA: Diagnosis not present

## 2018-04-14 DIAGNOSIS — Z79899 Other long term (current) drug therapy: Secondary | ICD-10-CM | POA: Diagnosis not present

## 2018-04-22 DIAGNOSIS — E291 Testicular hypofunction: Secondary | ICD-10-CM | POA: Diagnosis not present

## 2018-04-22 DIAGNOSIS — N5201 Erectile dysfunction due to arterial insufficiency: Secondary | ICD-10-CM | POA: Diagnosis not present

## 2018-05-13 DIAGNOSIS — E291 Testicular hypofunction: Secondary | ICD-10-CM | POA: Diagnosis not present

## 2018-05-13 DIAGNOSIS — N5201 Erectile dysfunction due to arterial insufficiency: Secondary | ICD-10-CM | POA: Diagnosis not present

## 2018-05-13 DIAGNOSIS — R972 Elevated prostate specific antigen [PSA]: Secondary | ICD-10-CM | POA: Diagnosis not present

## 2018-05-13 DIAGNOSIS — N401 Enlarged prostate with lower urinary tract symptoms: Secondary | ICD-10-CM | POA: Diagnosis not present

## 2018-05-27 DIAGNOSIS — J019 Acute sinusitis, unspecified: Secondary | ICD-10-CM | POA: Diagnosis not present

## 2018-05-27 DIAGNOSIS — J209 Acute bronchitis, unspecified: Secondary | ICD-10-CM | POA: Diagnosis not present

## 2018-05-27 DIAGNOSIS — R05 Cough: Secondary | ICD-10-CM | POA: Diagnosis not present

## 2018-05-27 DIAGNOSIS — F419 Anxiety disorder, unspecified: Secondary | ICD-10-CM | POA: Diagnosis not present

## 2018-09-23 DIAGNOSIS — E7800 Pure hypercholesterolemia, unspecified: Secondary | ICD-10-CM | POA: Diagnosis not present

## 2018-09-23 DIAGNOSIS — E785 Hyperlipidemia, unspecified: Secondary | ICD-10-CM | POA: Diagnosis not present

## 2018-09-23 DIAGNOSIS — M79652 Pain in left thigh: Secondary | ICD-10-CM | POA: Diagnosis not present

## 2018-09-23 DIAGNOSIS — Z112 Encounter for screening for other bacterial diseases: Secondary | ICD-10-CM | POA: Diagnosis not present

## 2018-09-23 DIAGNOSIS — Z118 Encounter for screening for other infectious and parasitic diseases: Secondary | ICD-10-CM | POA: Diagnosis not present

## 2018-09-23 DIAGNOSIS — J302 Other seasonal allergic rhinitis: Secondary | ICD-10-CM | POA: Diagnosis not present

## 2018-09-23 DIAGNOSIS — Z23 Encounter for immunization: Secondary | ICD-10-CM | POA: Diagnosis not present

## 2018-09-23 DIAGNOSIS — E290 Testicular hyperfunction: Secondary | ICD-10-CM | POA: Diagnosis not present

## 2018-09-23 DIAGNOSIS — Z79899 Other long term (current) drug therapy: Secondary | ICD-10-CM | POA: Diagnosis not present

## 2018-09-23 DIAGNOSIS — S76312A Strain of muscle, fascia and tendon of the posterior muscle group at thigh level, left thigh, initial encounter: Secondary | ICD-10-CM | POA: Diagnosis not present

## 2018-09-23 DIAGNOSIS — R7989 Other specified abnormal findings of blood chemistry: Secondary | ICD-10-CM | POA: Diagnosis not present

## 2018-09-23 DIAGNOSIS — F339 Major depressive disorder, recurrent, unspecified: Secondary | ICD-10-CM | POA: Diagnosis not present

## 2018-09-23 DIAGNOSIS — R300 Dysuria: Secondary | ICD-10-CM | POA: Diagnosis not present

## 2018-09-23 DIAGNOSIS — F419 Anxiety disorder, unspecified: Secondary | ICD-10-CM | POA: Diagnosis not present

## 2018-09-23 DIAGNOSIS — E559 Vitamin D deficiency, unspecified: Secondary | ICD-10-CM | POA: Diagnosis not present

## 2018-09-23 DIAGNOSIS — N390 Urinary tract infection, site not specified: Secondary | ICD-10-CM | POA: Diagnosis not present

## 2018-09-23 DIAGNOSIS — Z20818 Contact with and (suspected) exposure to other bacterial communicable diseases: Secondary | ICD-10-CM | POA: Diagnosis not present

## 2018-09-23 DIAGNOSIS — R5383 Other fatigue: Secondary | ICD-10-CM | POA: Diagnosis not present

## 2018-09-23 DIAGNOSIS — I10 Essential (primary) hypertension: Secondary | ICD-10-CM | POA: Diagnosis not present

## 2018-09-24 DIAGNOSIS — J301 Allergic rhinitis due to pollen: Secondary | ICD-10-CM | POA: Diagnosis not present

## 2018-10-20 DIAGNOSIS — R5383 Other fatigue: Secondary | ICD-10-CM | POA: Diagnosis not present

## 2018-10-20 DIAGNOSIS — R7989 Other specified abnormal findings of blood chemistry: Secondary | ICD-10-CM | POA: Diagnosis not present

## 2018-11-19 DIAGNOSIS — N401 Enlarged prostate with lower urinary tract symptoms: Secondary | ICD-10-CM | POA: Diagnosis not present

## 2018-11-19 DIAGNOSIS — E291 Testicular hypofunction: Secondary | ICD-10-CM | POA: Diagnosis not present

## 2018-11-19 DIAGNOSIS — Z79899 Other long term (current) drug therapy: Secondary | ICD-10-CM | POA: Diagnosis not present

## 2018-11-19 DIAGNOSIS — N486 Induration penis plastica: Secondary | ICD-10-CM | POA: Diagnosis not present

## 2019-01-02 DIAGNOSIS — F419 Anxiety disorder, unspecified: Secondary | ICD-10-CM | POA: Diagnosis not present

## 2019-01-02 DIAGNOSIS — F339 Major depressive disorder, recurrent, unspecified: Secondary | ICD-10-CM | POA: Diagnosis not present

## 2019-01-02 DIAGNOSIS — Z79899 Other long term (current) drug therapy: Secondary | ICD-10-CM | POA: Diagnosis not present

## 2019-01-02 DIAGNOSIS — I10 Essential (primary) hypertension: Secondary | ICD-10-CM | POA: Diagnosis not present

## 2019-01-02 DIAGNOSIS — J302 Other seasonal allergic rhinitis: Secondary | ICD-10-CM | POA: Diagnosis not present

## 2019-01-02 DIAGNOSIS — E291 Testicular hypofunction: Secondary | ICD-10-CM | POA: Diagnosis not present

## 2019-05-06 DIAGNOSIS — E871 Hypo-osmolality and hyponatremia: Secondary | ICD-10-CM | POA: Diagnosis not present

## 2019-05-06 DIAGNOSIS — G4700 Insomnia, unspecified: Secondary | ICD-10-CM | POA: Diagnosis not present

## 2019-05-06 DIAGNOSIS — F410 Panic disorder [episodic paroxysmal anxiety] without agoraphobia: Secondary | ICD-10-CM | POA: Diagnosis not present

## 2019-05-06 DIAGNOSIS — F419 Anxiety disorder, unspecified: Secondary | ICD-10-CM | POA: Diagnosis not present

## 2019-05-06 DIAGNOSIS — E7800 Pure hypercholesterolemia, unspecified: Secondary | ICD-10-CM | POA: Diagnosis not present

## 2019-05-06 DIAGNOSIS — F339 Major depressive disorder, recurrent, unspecified: Secondary | ICD-10-CM | POA: Diagnosis not present

## 2019-05-06 DIAGNOSIS — I10 Essential (primary) hypertension: Secondary | ICD-10-CM | POA: Diagnosis not present

## 2019-05-06 DIAGNOSIS — E559 Vitamin D deficiency, unspecified: Secondary | ICD-10-CM | POA: Diagnosis not present

## 2019-05-06 DIAGNOSIS — Z79899 Other long term (current) drug therapy: Secondary | ICD-10-CM | POA: Diagnosis not present

## 2019-05-06 DIAGNOSIS — E291 Testicular hypofunction: Secondary | ICD-10-CM | POA: Diagnosis not present

## 2019-06-22 ENCOUNTER — Ambulatory Visit: Payer: Managed Care, Other (non HMO) | Admitting: Urology

## 2019-06-29 ENCOUNTER — Ambulatory Visit: Payer: PPO | Admitting: Urology

## 2019-06-29 ENCOUNTER — Other Ambulatory Visit: Payer: Self-pay

## 2019-06-29 ENCOUNTER — Encounter: Payer: Self-pay | Admitting: Urology

## 2019-06-29 VITALS — BP 136/89 | HR 101 | Ht 71.0 in | Wt 176.9 lb

## 2019-06-29 DIAGNOSIS — Z8679 Personal history of other diseases of the circulatory system: Secondary | ICD-10-CM | POA: Insufficient documentation

## 2019-06-29 DIAGNOSIS — K635 Polyp of colon: Secondary | ICD-10-CM | POA: Insufficient documentation

## 2019-06-29 DIAGNOSIS — Z8639 Personal history of other endocrine, nutritional and metabolic disease: Secondary | ICD-10-CM | POA: Insufficient documentation

## 2019-06-29 DIAGNOSIS — N5201 Erectile dysfunction due to arterial insufficiency: Secondary | ICD-10-CM | POA: Diagnosis not present

## 2019-06-29 DIAGNOSIS — E291 Testicular hypofunction: Secondary | ICD-10-CM | POA: Diagnosis not present

## 2019-06-29 DIAGNOSIS — F331 Major depressive disorder, recurrent, moderate: Secondary | ICD-10-CM | POA: Insufficient documentation

## 2019-06-29 MED ORDER — TESTOSTERONE CYPIONATE 200 MG/ML IM SOLN: 100.0000 mg | INTRAMUSCULAR | 0 refills | Status: DC

## 2019-06-29 MED ORDER — SILDENAFIL CITRATE 20 MG PO TABS: ORAL_TABLET | ORAL | 0 refills | Status: DC

## 2019-06-29 NOTE — Progress Notes (Signed)
06/29/2019 9:33 AM   Brad Cummings August 17, 1951 [id number]  Referring provider: Lucille Passy, MD [place],  [place] [address]  Chief Complaint  Patient presents with  . Hypogonadism   Urologic history: 1.  Hypogonadism  -Currently on testosterone cypionate 100 mg weekly  -Previously on Androderm; compounded testosterone pellets  2.  Elevated PSA  -TRUS/biopsy 10/07/2013; PSA 6.6  -65 cc prostate  -Benign pathology  3.  Peyronie's disease  -Xiaflex 2015  4.  Erectile dysfunction  -On generic sildenafil 20 mg using 40-60 mg.   HPI: Brad Cummings is a 68 year old male who presents for transfer of urologic care from Dr. Yves Dill with urologic history as above.  He is currently injecting 100 mg testosterone cypionate weekly.  Primary symptoms were tiredness, fatigue and decreased libido which is improved on this dose.  He states his PCP checked his testosterone level approximately 2 weeks ago and it was 680.  His PSA was not checked.  Last PSA November 2019 was 4.6.  He denies bothersome lower urinary tract symptoms. Denies dysuria, gross hematuria or flank/abdominal/pelvic/scrotal pain.  He uses 40-60 mg of generic sildenafil for ED.  He has minimal penile curvature after Xiaflex.   PMH: Past Medical History:  Diagnosis Date  . Anxiety   . Colon polyp   . HLD (hyperlipidemia)   . HTN (hypertension)   . Hx of colonic polyps   . Staph infection   . Ventral hernia     Surgical History: Past Surgical History:  Procedure Laterality Date  . COLONOSCOPY W/ BIOPSIES    . knee scope Left 1989  . LAPAROSCOPY     umbilical hernia  . umbilcal hernia repair N/A     Home Medications:  Allergies as of 06/29/2019      Reactions   Penicillins       Medication List       Accurate as of June 29, 2019  9:33 AM. If you have any questions, ask your nurse or doctor.        acetaminophen 500 MG tablet Commonly known as: TYLENOL Take 500 mg by mouth  every 6 (six) hours as needed.   atorvastatin 40 MG tablet Commonly known as: LIPITOR Take 1 tablet (40 mg total) by mouth daily.   escitalopram 20 MG tablet Commonly known as: LEXAPRO Take 1 tablet (20 mg total) by mouth daily.   fenofibrate 48 MG tablet Commonly known as: Tricor Take 1 tablet (48 mg total) by mouth daily.   fluticasone 50 MCG/ACT nasal spray Commonly known as: FLONASE USE 2 SPRAYS IN EACH NOSTRIL EVERY DAY   lisinopril-hydrochlorothiazide 20-12.5 MG tablet Commonly known as: ZESTORETIC TAKE ONE TABLET EVERY MORNING   LORazepam 1 MG tablet Commonly known as: ATIVAN TAKE ONE TABLET THREE TIMES A DAY IF NEEDED FOR ANXIETY   PROBIOTIC DAILY PO Take 1 capsule by mouth daily.       Allergies:  Allergies  Allergen Reactions  . Penicillins     Family History: Family History  Problem Relation Age of Onset  . Colon polyps Brother   . Colon polyps Father   . Diabetes Father   . Heart disease Father   . Colon cancer Brother 72       removed a foot of his colon  . Heart disease Brother     Social History:  reports that he has never smoked. He has never used smokeless tobacco. He reports current alcohol use of about 18.0 standard  drinks of alcohol per week. He reports that he does not use drugs.  ROS: UROLOGY Frequent Urination?: No Hard to postpone urination?: No Burning/pain with urination?: No Get up at night to urinate?: No Leakage of urine?: No Urine stream starts and stops?: No Trouble starting stream?: No Do you have to strain to urinate?: No Blood in urine?: No Urinary tract infection?: No Sexually transmitted disease?: No Injury to kidneys or bladder?: No Painful intercourse?: No Weak stream?: No Erection problems?: No Penile pain?: No  Gastrointestinal Nausea?: No Vomiting?: No Indigestion/heartburn?: No Diarrhea?: No Constipation?: No  Constitutional Fever: No Night sweats?: No Weight loss?: No Fatigue?: No  Skin  Skin rash/lesions?: No Itching?: No  Eyes Blurred vision?: No Double vision?: No  Ears/Nose/Throat Sore throat?: No Sinus problems?: Yes  Hematologic/Lymphatic Swollen glands?: No Easy bruising?: No  Cardiovascular Leg swelling?: No Chest pain?: No  Respiratory Cough?: No Shortness of breath?: No  Endocrine Excessive thirst?: No  Musculoskeletal Back pain?: No Joint pain?: No  Neurological Headaches?: No Dizziness?: No  Psychologic Depression?: No Anxiety?: Yes  Physical Exam: BP 136/89 (BP Location: Left Arm, Patient Position: Sitting, Cuff Size: Normal)   Pulse (!) 101   Ht 5\' 11"  (1.803 m)   Wt 176 lb 14.4 oz (80.2 kg)   BMI 24.67 kg/m   Constitutional:  Alert and oriented, No acute distress. HEENT: [place] AT, moist mucus membranes.  Trachea midline, no masses. Cardiovascular: No clubbing, cyanosis, or edema. Respiratory: Normal respiratory effort, no increased work of breathing. GU: declined DRE as it was checked 6 months ago Skin: No rashes, bruises or suspicious lesions. Neurologic: Grossly intact, no focal deficits, moving all 4 extremities. Psychiatric: Normal mood and affect.   Assessment & Plan:   68 year old male with stable hypogonadism on TRT.  PSA and hematocrit were drawn today.  Testosterone and sildenafil were refilled.  Follow-up 6 months for monitoring labs and DRE.  Return in about 6 months (around 12/29/2019) for Recheck with labs (PSA, hematocrit, testosterone).  Abbie Sons, [place] [address], [place], [place] [address] [phone]

## 2019-06-30 ENCOUNTER — Telehealth: Payer: Self-pay | Admitting: Family Medicine

## 2019-06-30 LAB — PSA: Prostate Specific Ag, Serum: 6.3 ng/mL — ABNORMAL HIGH (ref 0.0–4.0)

## 2019-06-30 LAB — HEMATOCRIT: Hematocrit: 49.4 % (ref 37.5–51.0)

## 2019-06-30 NOTE — Telephone Encounter (Signed)
-----   Message from Abbie Sons, MD sent at 06/30/2019 10:23 AM EDT ----- PSA stable at 6.3.  Keep scheduled follow-up

## 2019-06-30 NOTE — Telephone Encounter (Signed)
Patient notified and voiced understanding.

## 2019-08-07 DIAGNOSIS — F419 Anxiety disorder, unspecified: Secondary | ICD-10-CM | POA: Diagnosis not present

## 2019-08-07 DIAGNOSIS — F339 Major depressive disorder, recurrent, unspecified: Secondary | ICD-10-CM | POA: Diagnosis not present

## 2019-08-07 DIAGNOSIS — G4700 Insomnia, unspecified: Secondary | ICD-10-CM | POA: Diagnosis not present

## 2019-08-07 DIAGNOSIS — I10 Essential (primary) hypertension: Secondary | ICD-10-CM | POA: Diagnosis not present

## 2019-08-07 DIAGNOSIS — Z79899 Other long term (current) drug therapy: Secondary | ICD-10-CM | POA: Diagnosis not present

## 2019-08-07 DIAGNOSIS — F410 Panic disorder [episodic paroxysmal anxiety] without agoraphobia: Secondary | ICD-10-CM | POA: Diagnosis not present

## 2019-08-10 DIAGNOSIS — M5136 Other intervertebral disc degeneration, lumbar region: Secondary | ICD-10-CM | POA: Diagnosis not present

## 2019-08-10 DIAGNOSIS — M9901 Segmental and somatic dysfunction of cervical region: Secondary | ICD-10-CM | POA: Diagnosis not present

## 2019-08-10 DIAGNOSIS — M9903 Segmental and somatic dysfunction of lumbar region: Secondary | ICD-10-CM | POA: Diagnosis not present

## 2019-08-10 DIAGNOSIS — R51 Headache: Secondary | ICD-10-CM | POA: Diagnosis not present

## 2019-08-20 DIAGNOSIS — H04332 Acute lacrimal canaliculitis of left lacrimal passage: Secondary | ICD-10-CM | POA: Diagnosis not present

## 2019-10-06 ENCOUNTER — Ambulatory Visit: Payer: Managed Care, Other (non HMO) | Admitting: Primary Care

## 2019-10-12 ENCOUNTER — Telehealth: Payer: Self-pay | Admitting: Urology

## 2019-10-12 NOTE — Telephone Encounter (Signed)
Pt would like a call back to discuss the testosterone rx that was written on 06/29/2019.

## 2019-10-13 NOTE — Telephone Encounter (Signed)
Called pt no answer. Unable to leave message as no  DPR is on file. 1st attempt.  

## 2019-10-14 NOTE — Telephone Encounter (Signed)
Sent mychart message

## 2019-10-19 ENCOUNTER — Other Ambulatory Visit: Payer: Self-pay | Admitting: Urology

## 2019-10-19 NOTE — Telephone Encounter (Signed)
Called pt he is questioning how long his testosterone rx should last. Advised pt that he was given a 36mL and is only to do 0.76mL weekly therefore vial will last him several weeks (approx 37mos) Pt gave verbal understanding.

## 2019-10-19 NOTE — Telephone Encounter (Signed)
Pt returned call and would like for you to call him back concerning testosterone RX.

## 2019-12-23 ENCOUNTER — Other Ambulatory Visit: Payer: Self-pay

## 2019-12-23 ENCOUNTER — Other Ambulatory Visit: Payer: 59

## 2019-12-23 DIAGNOSIS — E291 Testicular hypofunction: Secondary | ICD-10-CM

## 2019-12-24 LAB — TESTOSTERONE: Testosterone: >1500 ng/dL — ABNORMAL HIGH (ref 264–916)

## 2019-12-24 LAB — HEMATOCRIT: Hematocrit: 45.5 % (ref 37.5–51.0)

## 2019-12-24 LAB — PSA: Prostate Specific Ag, Serum: 3.6 ng/mL (ref 0.0–4.0)

## 2019-12-28 NOTE — Progress Notes (Signed)
error 

## 2019-12-29 ENCOUNTER — Other Ambulatory Visit: Payer: Self-pay

## 2019-12-29 ENCOUNTER — Other Ambulatory Visit: Payer: Managed Care, Other (non HMO)

## 2019-12-29 ENCOUNTER — Ambulatory Visit (INDEPENDENT_AMBULATORY_CARE_PROVIDER_SITE_OTHER): Payer: 59 | Admitting: Urology

## 2019-12-29 ENCOUNTER — Telehealth: Payer: Self-pay

## 2019-12-29 ENCOUNTER — Encounter: Payer: Self-pay | Admitting: Urology

## 2019-12-29 VITALS — BP 155/88 | HR 84 | Ht 71.0 in | Wt 184.7 lb

## 2019-12-29 DIAGNOSIS — E291 Testicular hypofunction: Secondary | ICD-10-CM

## 2019-12-29 DIAGNOSIS — R972 Elevated prostate specific antigen [PSA]: Secondary | ICD-10-CM | POA: Diagnosis not present

## 2019-12-29 DIAGNOSIS — N5201 Erectile dysfunction due to arterial insufficiency: Secondary | ICD-10-CM

## 2019-12-29 MED ORDER — SILDENAFIL CITRATE 20 MG PO TABS: ORAL_TABLET | ORAL | 0 refills | Status: DC

## 2019-12-29 MED ORDER — TESTOSTERONE CYPIONATE 200 MG/ML IM SOLN: 100.0000 mg | INTRAMUSCULAR | 0 refills | Status: DC

## 2019-12-29 NOTE — Telephone Encounter (Signed)
-----   Message from Abbie Sons, MD sent at 12/28/2019 11:25 AM EST ----- Testosterone level was >1500.  When was his last injection in relation to blood draw?  PSA was 3.6 and hematocrit was normal

## 2019-12-29 NOTE — Telephone Encounter (Signed)
Pt informed

## 2019-12-29 NOTE — Progress Notes (Signed)
12/29/2019 11:14 AM   Brad Cummings 09/16/1950 [id number]  Referring provider: Remi Haggard, [place],  [place] [address]  Chief Complaint  Patient presents with  . Hypogonadism    HPI: 68 y.o. male initially seen 06/2019 for transfer of urologic care from Dr. Yves Dill.  He has hypogonadism on TRT and was also followed for an elevated PSA with prior benign biopsy, Peyronie's disease treated with Xiaflex and erectile dysfunction on sildenafil.  He has been injecting 100 mg weekly.  Blood work obtained on 12/23/2019 was remarkable for a testosterone level >1500.  His hematocrit was normal at 45.5 and PSA was normal at 3.6.  He states TRT has helped his tiredness and fatigue although recently does feel more irritable.  He remains on generic sildenafil for ED.   PMH: Past Medical History:  Diagnosis Date  . Anxiety   . Colon polyp   . HLD (hyperlipidemia)   . HTN (hypertension)   . Hx of colonic polyps   . Hypogonadism in male   . Staph infection   . Ventral hernia     Surgical History: Past Surgical History:  Procedure Laterality Date  . COLONOSCOPY W/ BIOPSIES    . knee scope Left 1989  . LAPAROSCOPY     umbilical hernia  . umbilcal hernia repair N/A     Home Medications:  Allergies as of 12/29/2019      Reactions   Penicillins       Medication List       Accurate as of December 29, 2019 11:14 AM. If you have any questions, ask your nurse or doctor.        STOP taking these medications   LORazepam 1 MG tablet Commonly known as: ATIVAN Stopped by: Abbie Sons, MD     TAKE these medications   acetaminophen 500 MG tablet Commonly known as: TYLENOL Take 500 mg by mouth every 6 (six) hours as needed.   atorvastatin 40 MG tablet Commonly known as: LIPITOR Take 1 tablet (40 mg total) by mouth daily.   buPROPion 300 MG 24 hr tablet Commonly known as: WELLBUTRIN XL   clonazePAM 1 MG tablet Commonly known as: KLONOPIN     escitalopram 20 MG tablet Commonly known as: LEXAPRO Take 1 tablet (20 mg total) by mouth daily.   fenofibrate 48 MG tablet Commonly known as: Tricor Take 1 tablet (48 mg total) by mouth daily.   fluticasone 50 MCG/ACT nasal spray Commonly known as: FLONASE USE 2 SPRAYS IN EACH NOSTRIL EVERY DAY   lisinopril-hydrochlorothiazide 20-12.5 MG tablet Commonly known as: ZESTORETIC TAKE ONE TABLET EVERY MORNING   neomycin-polymyxin b-dexamethasone 3.5-10000-0.1 Oint Commonly known as: MAXITROL   PROBIOTIC DAILY PO Take 1 capsule by mouth daily.   sildenafil 20 MG tablet Commonly known as: REVATIO 2-5 tabs 1 hour prior to intercourse   testosterone cypionate 200 MG/ML injection Commonly known as: DEPOTESTOSTERONE CYPIONATE INJECT 0.5MLS INTO THE MUSCLE ONCE A WEEK       Allergies:  Allergies  Allergen Reactions  . Penicillins     Family History: Family History  Problem Relation Age of Onset  . Colon polyps Brother   . Colon polyps Father   . Diabetes Father   . Heart disease Father   . Colon cancer Brother 46       removed a foot of his colon  . Heart disease Brother     Social History:  reports that he has never smoked. He  has never used smokeless tobacco. He reports current alcohol use of about 18.0 standard drinks of alcohol per week. He reports that he does not use drugs.  ROS: UROLOGY Frequent Urination?: No Hard to postpone urination?: No Burning/pain with urination?: No Get up at night to urinate?: No Leakage of urine?: No Urine stream starts and stops?: No Trouble starting stream?: No Do you have to strain to urinate?: No Blood in urine?: No Urinary tract infection?: No Sexually transmitted disease?: No Injury to kidneys or bladder?: No Painful intercourse?: No Weak stream?: No Erection problems?: No Penile pain?: No  Gastrointestinal Nausea?: No Vomiting?: No Indigestion/heartburn?: No Diarrhea?: No Constipation?:  No  Constitutional Fever: No Night sweats?: No Weight loss?: No Fatigue?: No  Skin Skin rash/lesions?: No Itching?: No  Eyes Blurred vision?: No Double vision?: No  Ears/Nose/Throat Sore throat?: No Sinus problems?: Yes  Hematologic/Lymphatic Swollen glands?: No Easy bruising?: No  Cardiovascular Leg swelling?: No Chest pain?: No  Respiratory Cough?: No Shortness of breath?: No  Endocrine Excessive thirst?: No  Musculoskeletal Back pain?: No Joint pain?: No  Neurological Headaches?: No Dizziness?: No  Psychologic Depression?: Yes Anxiety?: Yes  Physical Exam: BP (!) 155/88 (BP Location: Left Arm, Patient Position: Sitting, Cuff Size: Normal)   Pulse 84   Ht 5\' 11"  (1.803 m)   Wt 184 lb 11.2 oz (83.8 kg)   BMI 25.76 kg/m   Constitutional:  Alert and oriented, No acute distress. HEENT: [place] AT, moist mucus membranes.  Trachea midline, no masses. Cardiovascular: No clubbing, cyanosis, or edema. Respiratory: Normal respiratory effort, no increased work of breathing. Skin: No rashes, bruises or suspicious lesions. Neurologic: Grossly intact, no focal deficits, moving all 4 extremities. Psychiatric: Normal mood and affect.   Assessment & Plan:    - Hypogonadism Testosterone level is significantly elevated.  I discussed lowering his weekly dose or continuing the same dose every other week.  He has elected the latter.  Follow-up testosterone level in 4 weeks.  Testosterone was refilled  - Erectile dysfunction Sildenafil was refilled.  - Elevated PSA 12/20 PSA ws normal   Abbie Sons, MD  [facility] [address] [place], [place] [address] [phone]

## 2019-12-30 ENCOUNTER — Encounter: Payer: Self-pay | Admitting: Urology

## 2019-12-30 DIAGNOSIS — N5201 Erectile dysfunction due to arterial insufficiency: Secondary | ICD-10-CM | POA: Insufficient documentation

## 2019-12-30 DIAGNOSIS — E291 Testicular hypofunction: Secondary | ICD-10-CM | POA: Insufficient documentation

## 2019-12-30 DIAGNOSIS — R972 Elevated prostate specific antigen [PSA]: Secondary | ICD-10-CM | POA: Insufficient documentation

## 2019-12-31 ENCOUNTER — Ambulatory Visit: Payer: Managed Care, Other (non HMO) | Admitting: Urology

## 2020-01-26 ENCOUNTER — Other Ambulatory Visit: Payer: Self-pay

## 2020-01-26 DIAGNOSIS — E291 Testicular hypofunction: Secondary | ICD-10-CM

## 2020-01-27 ENCOUNTER — Other Ambulatory Visit: Payer: 59

## 2020-01-27 ENCOUNTER — Other Ambulatory Visit: Payer: Self-pay

## 2020-01-27 DIAGNOSIS — E291 Testicular hypofunction: Secondary | ICD-10-CM

## 2020-01-28 LAB — TESTOSTERONE: Testosterone: 902 ng/dL (ref 264–916)

## 2020-02-01 ENCOUNTER — Telehealth: Payer: Self-pay | Admitting: *Deleted

## 2020-02-01 NOTE — Telephone Encounter (Signed)
Patient notified

## 2020-02-01 NOTE — Telephone Encounter (Signed)
Pt returned call and I read him the result note and gave him his follow up appt. Pt voiced understanding.

## 2020-02-01 NOTE — Telephone Encounter (Signed)
-----   Message from Abbie Sons, MD sent at 01/31/2020  1:04 PM EST ----- Repeat testosterone level better at 902.  Continue present dose.  Follow-up as scheduled

## 2020-03-28 ENCOUNTER — Telehealth: Payer: Self-pay

## 2020-03-28 NOTE — Telephone Encounter (Signed)
Patient left message wanting to discuss his testosterone.  Called patient back and left message.

## 2020-03-30 NOTE — Telephone Encounter (Signed)
Patient called regarding testosterone. He is currently on 0.5 ML every 2 weeks. Patient states he can not take such a low dose, he will "go crazy". He realized when he got his last Testosterone he was taking a supplement called "complex 1000" and states that was increasing his tesosterone. He stated being on this low of a dose " he hates himself" and wants to go back on 1 ML every week. He reported that he did discontinue taking the supplement.

## 2020-03-31 NOTE — Telephone Encounter (Signed)
He needs a lab visit for a testosterone level 1 week after an injection to determine any dosage change.

## 2020-03-31 NOTE — Telephone Encounter (Signed)
Left message for patient to schedule appt for lab for testosterone

## 2020-04-04 ENCOUNTER — Other Ambulatory Visit: Payer: Self-pay

## 2020-04-04 DIAGNOSIS — E291 Testicular hypofunction: Secondary | ICD-10-CM

## 2020-04-08 ENCOUNTER — Other Ambulatory Visit: Payer: Self-pay

## 2020-04-08 ENCOUNTER — Other Ambulatory Visit: Payer: 59

## 2020-04-08 DIAGNOSIS — E291 Testicular hypofunction: Secondary | ICD-10-CM

## 2020-04-09 LAB — TESTOSTERONE: Testosterone: 554 ng/dL (ref 264–916)

## 2020-04-18 ENCOUNTER — Telehealth: Payer: Self-pay

## 2020-04-18 NOTE — Telephone Encounter (Signed)
Pt calls and states that he is very depressed and believes that it is due to his low T levels. He states that his levels are typically in the [phone] range and his most recent was only 554. Pt would like to increase his dose. Please advise.

## 2020-04-19 NOTE — Telephone Encounter (Signed)
Patient states he injects 0.78ml every 2weeks but would like to increase to 0.16ml every week

## 2020-04-19 NOTE — Telephone Encounter (Signed)
Patient states his last injection before his blood work was 04/01/2020. His lab was done on 4/9/221

## 2020-04-19 NOTE — Telephone Encounter (Signed)
Please find out when his last injection was in relation to this most recent blood draw

## 2020-04-20 MED ORDER — TESTOSTERONE CYPIONATE 200 MG/ML IM SOLN: 100.0000 mg | INTRAMUSCULAR | 0 refills | Status: DC

## 2020-04-20 NOTE — Telephone Encounter (Signed)
Can increase to 0.5 cc weekly however will need a trough testosterone level 1 month after the dose change.  1 month Rx sent reflecting this dose increase.

## 2020-04-21 NOTE — Telephone Encounter (Signed)
Notified patient as instructed, patient pleased. Discussed follow-up appointments, patient agrees  

## 2020-05-19 ENCOUNTER — Other Ambulatory Visit: Payer: Self-pay

## 2020-05-19 DIAGNOSIS — E291 Testicular hypofunction: Secondary | ICD-10-CM

## 2020-05-20 ENCOUNTER — Other Ambulatory Visit: Payer: Self-pay

## 2020-05-26 ENCOUNTER — Other Ambulatory Visit: Payer: Self-pay

## 2020-05-31 ENCOUNTER — Other Ambulatory Visit: Payer: Self-pay

## 2020-05-31 DIAGNOSIS — E291 Testicular hypofunction: Secondary | ICD-10-CM

## 2020-06-01 ENCOUNTER — Other Ambulatory Visit: Payer: Self-pay

## 2020-06-01 ENCOUNTER — Other Ambulatory Visit: Payer: 59

## 2020-06-01 DIAGNOSIS — E291 Testicular hypofunction: Secondary | ICD-10-CM

## 2020-06-02 LAB — TESTOSTERONE: Testosterone: 483 ng/dL (ref 264–916)

## 2020-06-03 ENCOUNTER — Telehealth: Payer: Self-pay | Admitting: *Deleted

## 2020-06-03 NOTE — Telephone Encounter (Signed)
Incoming call from pt informed him of the information below. Pt gave verbal understanding. Pt requested appointment, appt scheduled.

## 2020-06-03 NOTE — Telephone Encounter (Signed)
Pt called and left voicemail wanting lab results, returned pt's call. No answer. Unable to leave message as no DPR is on file.

## 2020-06-03 NOTE — Telephone Encounter (Signed)
-----   Message from Abbie Sons, MD sent at 06/03/2020  9:15 AM EDT ----- Trough testosterone level looks good at 483.  Continue present dose

## 2020-06-10 ENCOUNTER — Encounter: Payer: Self-pay | Admitting: Intensive Care

## 2020-06-10 ENCOUNTER — Emergency Department: Payer: 59

## 2020-06-10 ENCOUNTER — Ambulatory Visit: Payer: Self-pay | Admitting: Urology

## 2020-06-10 ENCOUNTER — Other Ambulatory Visit: Payer: Self-pay

## 2020-06-10 DIAGNOSIS — E785 Hyperlipidemia, unspecified: Secondary | ICD-10-CM | POA: Diagnosis present

## 2020-06-10 DIAGNOSIS — Z880 Allergy status to penicillin: Secondary | ICD-10-CM

## 2020-06-10 DIAGNOSIS — Z79899 Other long term (current) drug therapy: Secondary | ICD-10-CM

## 2020-06-10 DIAGNOSIS — Z800 Family history of malignant neoplasm of digestive organs: Secondary | ICD-10-CM

## 2020-06-10 DIAGNOSIS — Z8249 Family history of ischemic heart disease and other diseases of the circulatory system: Secondary | ICD-10-CM

## 2020-06-10 DIAGNOSIS — Z8371 Family history of colonic polyps: Secondary | ICD-10-CM

## 2020-06-10 DIAGNOSIS — K3521 Acute appendicitis with generalized peritonitis, with abscess: Principal | ICD-10-CM | POA: Diagnosis present

## 2020-06-10 DIAGNOSIS — Z8719 Personal history of other diseases of the digestive system: Secondary | ICD-10-CM

## 2020-06-10 DIAGNOSIS — Z20822 Contact with and (suspected) exposure to covid-19: Secondary | ICD-10-CM | POA: Diagnosis present

## 2020-06-10 DIAGNOSIS — I10 Essential (primary) hypertension: Secondary | ICD-10-CM | POA: Diagnosis present

## 2020-06-10 DIAGNOSIS — Z833 Family history of diabetes mellitus: Secondary | ICD-10-CM

## 2020-06-10 DIAGNOSIS — K3532 Acute appendicitis with perforation and localized peritonitis, without abscess: Secondary | ICD-10-CM | POA: Diagnosis not present

## 2020-06-10 LAB — COMPREHENSIVE METABOLIC PANEL
ALT: 17 U/L (ref 0–44)
AST: 22 U/L (ref 15–41)
Albumin: 4.6 g/dL (ref 3.5–5.0)
Alkaline Phosphatase: 29 U/L — ABNORMAL LOW (ref 38–126)
Anion gap: 11 (ref 5–15)
BUN: 13 mg/dL (ref 8–23)
CO2: 26 mmol/L (ref 22–32)
Calcium: 9.8 mg/dL (ref 8.9–10.3)
Chloride: 94 mmol/L — ABNORMAL LOW (ref 98–111)
Creatinine, Ser: 1.34 mg/dL — ABNORMAL HIGH (ref 0.61–1.24)
GFR calc Af Amer: >60 mL/min (ref >60)
GFR calc non Af Amer: 53 mL/min — ABNORMAL LOW (ref >60)
Glucose, Bld: 118 mg/dL — ABNORMAL HIGH (ref 70–99)
Potassium: 3.2 mmol/L — ABNORMAL LOW (ref 3.5–5.1)
Sodium: 131 mmol/L — ABNORMAL LOW (ref 135–145)
Total Bilirubin: 1.5 mg/dL — ABNORMAL HIGH (ref 0.3–1.2)
Total Protein: 8.1 g/dL (ref 6.5–8.1)

## 2020-06-10 LAB — LIPASE, BLOOD: Lipase: 25 U/L (ref 11–51)

## 2020-06-10 LAB — CBC
HCT: 47.8 % (ref 39.0–52.0)
Hemoglobin: 17.4 g/dL — ABNORMAL HIGH (ref 13.0–17.0)
MCH: 33.3 pg (ref 26.0–34.0)
MCHC: 36.4 g/dL — ABNORMAL HIGH (ref 30.0–36.0)
MCV: 91.4 fL (ref 80.0–100.0)
Platelets: 370 10*3/uL (ref 150–400)
RBC: 5.23 MIL/uL (ref 4.22–5.81)
RDW: 11.8 % (ref 11.5–15.5)
WBC: 8.7 10*3/uL (ref 4.0–10.5)
nRBC: 0 % (ref 0.0–0.2)

## 2020-06-10 MED ORDER — IOHEXOL 300 MG/ML  SOLN
100.0000 mL | Freq: Once | INTRAMUSCULAR | Status: AC | PRN
  Administered 2020-06-10: 100 mL via INTRAVENOUS

## 2020-06-10 NOTE — ED Notes (Signed)
Pt updated multiple times on wait.

## 2020-06-10 NOTE — ED Triage Notes (Signed)
Patient arrived by EMS from home for right sided flank/abdominl pain. Denies urinary symptoms. EMS gave 100mg  fentanyl and 4mg  zofran.

## 2020-06-10 NOTE — ED Notes (Signed)
Pt provided with blanket by security.

## 2020-06-11 ENCOUNTER — Inpatient Hospital Stay: Payer: 59 | Admitting: Anesthesiology

## 2020-06-11 ENCOUNTER — Encounter
Admission: EM | Disposition: A | Discharge status: Home / Self Care | Payer: Self-pay | Source: Home / Self Care | Attending: General Surgery

## 2020-06-11 ENCOUNTER — Inpatient Hospital Stay
Admission: EM | Admit: 2020-06-11 | Discharge: 2020-06-13 | DRG: 340 | Disposition: A | Discharge status: Home / Self Care | Payer: 59 | Attending: General Surgery | Admitting: General Surgery

## 2020-06-11 DIAGNOSIS — I10 Essential (primary) hypertension: Secondary | ICD-10-CM | POA: Diagnosis present

## 2020-06-11 DIAGNOSIS — E785 Hyperlipidemia, unspecified: Secondary | ICD-10-CM | POA: Diagnosis present

## 2020-06-11 DIAGNOSIS — K3532 Acute appendicitis with perforation and localized peritonitis, without abscess: Secondary | ICD-10-CM | POA: Diagnosis present

## 2020-06-11 DIAGNOSIS — Z8371 Family history of colonic polyps: Secondary | ICD-10-CM | POA: Diagnosis not present

## 2020-06-11 DIAGNOSIS — K3521 Acute appendicitis with generalized peritonitis, with abscess: Secondary | ICD-10-CM | POA: Diagnosis present

## 2020-06-11 DIAGNOSIS — Z800 Family history of malignant neoplasm of digestive organs: Secondary | ICD-10-CM | POA: Diagnosis not present

## 2020-06-11 DIAGNOSIS — Z880 Allergy status to penicillin: Secondary | ICD-10-CM | POA: Diagnosis not present

## 2020-06-11 DIAGNOSIS — Z79899 Other long term (current) drug therapy: Secondary | ICD-10-CM | POA: Diagnosis not present

## 2020-06-11 DIAGNOSIS — Z8249 Family history of ischemic heart disease and other diseases of the circulatory system: Secondary | ICD-10-CM | POA: Diagnosis not present

## 2020-06-11 DIAGNOSIS — Z8719 Personal history of other diseases of the digestive system: Secondary | ICD-10-CM | POA: Diagnosis not present

## 2020-06-11 DIAGNOSIS — Z833 Family history of diabetes mellitus: Secondary | ICD-10-CM | POA: Diagnosis not present

## 2020-06-11 DIAGNOSIS — Z20822 Contact with and (suspected) exposure to covid-19: Secondary | ICD-10-CM | POA: Diagnosis present

## 2020-06-11 HISTORY — PX: LAPAROSCOPIC APPENDECTOMY: SHX408

## 2020-06-11 LAB — COMPREHENSIVE METABOLIC PANEL
ALT: 12 U/L (ref 0–44)
AST: 21 U/L (ref 15–41)
Albumin: 4 g/dL (ref 3.5–5.0)
Alkaline Phosphatase: 21 U/L — ABNORMAL LOW (ref 38–126)
Anion gap: 14 (ref 5–15)
BUN: 16 mg/dL (ref 8–23)
CO2: 29 mmol/L (ref 22–32)
Calcium: 9.3 mg/dL (ref 8.9–10.3)
Chloride: 92 mmol/L — ABNORMAL LOW (ref 98–111)
Creatinine, Ser: 1.56 mg/dL — ABNORMAL HIGH (ref 0.61–1.24)
GFR calc Af Amer: 51 mL/min — ABNORMAL LOW (ref >60)
GFR calc non Af Amer: 44 mL/min — ABNORMAL LOW (ref >60)
Glucose, Bld: 99 mg/dL (ref 70–99)
Potassium: 4.4 mmol/L (ref 3.5–5.1)
Sodium: 135 mmol/L (ref 135–145)
Total Bilirubin: 0.7 mg/dL (ref 0.3–1.2)
Total Protein: 7.2 g/dL (ref 6.5–8.1)

## 2020-06-11 LAB — CBC
HCT: 44.2 % (ref 39.0–52.0)
Hemoglobin: 16.2 g/dL (ref 13.0–17.0)
MCH: 33.5 pg (ref 26.0–34.0)
MCHC: 36.7 g/dL — ABNORMAL HIGH (ref 30.0–36.0)
MCV: 91.3 fL (ref 80.0–100.0)
Platelets: 357 10*3/uL (ref 150–400)
RBC: 4.84 MIL/uL (ref 4.22–5.81)
RDW: 11.9 % (ref 11.5–15.5)
WBC: 14 10*3/uL — ABNORMAL HIGH (ref 4.0–10.5)
nRBC: 0 % (ref 0.0–0.2)

## 2020-06-11 LAB — PHOSPHORUS
Phosphorus: 4.1 mg/dL (ref 2.5–4.6)
Phosphorus: 1.7 mg/dL — ABNORMAL LOW (ref 2.5–4.6)

## 2020-06-11 LAB — SARS CORONAVIRUS 2 BY RT PCR (HOSPITAL ORDER, PERFORMED IN ~~LOC~~ HOSPITAL LAB): SARS Coronavirus 2: NEGATIVE

## 2020-06-11 LAB — MAGNESIUM: Magnesium: 1 mg/dL — ABNORMAL LOW (ref 1.7–2.4)

## 2020-06-11 SURGERY — APPENDECTOMY, LAPAROSCOPIC
Anesthesia: General | Site: Abdomen

## 2020-06-11 MED ORDER — METOPROLOL TARTRATE 5 MG/5ML IV SOLN: 5.0000 mg | Freq: Four times a day (QID) | INTRAVENOUS | Status: DC | PRN

## 2020-06-11 MED ORDER — KCL IN DEXTROSE-NACL 40-5-0.9 MEQ/L-%-% IV SOLN
INTRAVENOUS | Status: DC
  Filled 2020-06-11 (×6): qty 1000

## 2020-06-11 MED ORDER — ACETAMINOPHEN 325 MG PO TABS
650.0000 mg | ORAL_TABLET | Freq: Four times a day (QID) | ORAL | Status: DC | PRN
  Administered 2020-06-11 – 2020-06-12 (×2): 650 mg via ORAL
  Filled 2020-06-11 (×2): qty 2

## 2020-06-11 MED ORDER — OXYCODONE HCL 5 MG PO TABS
5.0000 mg | ORAL_TABLET | ORAL | Status: DC | PRN
  Administered 2020-06-11 – 2020-06-13 (×4): 5 mg via ORAL
  Filled 2020-06-11 (×5): qty 1

## 2020-06-11 MED ORDER — ONDANSETRON HCL 4 MG/2ML IJ SOLN: 4.0000 mg | Freq: Four times a day (QID) | INTRAMUSCULAR | Status: DC | PRN

## 2020-06-11 MED ORDER — ROCURONIUM BROMIDE 100 MG/10ML IV SOLN
INTRAVENOUS | Status: DC | PRN
  Administered 2020-06-11: 45 mg via INTRAVENOUS

## 2020-06-11 MED ORDER — FENTANYL CITRATE (PF) 100 MCG/2ML IJ SOLN
INTRAMUSCULAR | Status: DC | PRN
  Administered 2020-06-11: 75 ug via INTRAVENOUS

## 2020-06-11 MED ORDER — SODIUM CHLORIDE 0.9 % IV SOLN
1.0000 g | INTRAVENOUS | Status: DC
  Administered 2020-06-11 – 2020-06-12 (×2): 1 g via INTRAVENOUS
  Filled 2020-06-11: qty 1
  Filled 2020-06-11: qty 10
  Filled 2020-06-11: qty 1

## 2020-06-11 MED ORDER — LIDOCAINE-EPINEPHRINE 1 %-1:100000 IJ SOLN
INTRAMUSCULAR | Status: DC | PRN
  Administered 2020-06-11: 10 mL

## 2020-06-11 MED ORDER — ACETAMINOPHEN 650 MG RE SUPP: 650.0000 mg | Freq: Four times a day (QID) | RECTAL | Status: DC | PRN

## 2020-06-11 MED ORDER — DEXAMETHASONE SODIUM PHOSPHATE 10 MG/ML IJ SOLN
INTRAMUSCULAR | Status: DC | PRN
  Administered 2020-06-11: 8 mg via INTRAVENOUS

## 2020-06-11 MED ORDER — POTASSIUM CHLORIDE 10 MEQ/100ML IV SOLN: 10.0000 meq | Freq: Once | INTRAVENOUS | Status: DC

## 2020-06-11 MED ORDER — LACTATED RINGERS IV SOLN: INTRAVENOUS | Status: DC | PRN

## 2020-06-11 MED ORDER — LACTATED RINGERS IV BOLUS
1000.0000 mL | Freq: Once | INTRAVENOUS | Status: AC
  Administered 2020-06-11: 1000 mL via INTRAVENOUS

## 2020-06-11 MED ORDER — LACTATED RINGERS IV SOLN: INTRAVENOUS | Status: DC

## 2020-06-11 MED ORDER — LIDOCAINE HCL (CARDIAC) PF 100 MG/5ML IV SOSY
PREFILLED_SYRINGE | INTRAVENOUS | Status: DC | PRN
  Administered 2020-06-11: 80 mg via INTRAVENOUS

## 2020-06-11 MED ORDER — SIMETHICONE 80 MG PO CHEW
40.0000 mg | CHEWABLE_TABLET | Freq: Four times a day (QID) | ORAL | Status: DC | PRN
  Filled 2020-06-11: qty 1

## 2020-06-11 MED ORDER — ALPRAZOLAM 1 MG PO TABS
1.0000 mg | ORAL_TABLET | Freq: Two times a day (BID) | ORAL | Status: DC
  Administered 2020-06-11 – 2020-06-13 (×4): 1 mg via ORAL
  Filled 2020-06-11 (×4): qty 1

## 2020-06-11 MED ORDER — FENTANYL CITRATE (PF) 100 MCG/2ML IJ SOLN: 25.0000 ug | INTRAMUSCULAR | Status: DC | PRN

## 2020-06-11 MED ORDER — ONDANSETRON 4 MG PO TBDP
4.0000 mg | ORAL_TABLET | Freq: Four times a day (QID) | ORAL | Status: DC | PRN
  Filled 2020-06-11: qty 1

## 2020-06-11 MED ORDER — KCL IN DEXTROSE-NACL 40-5-0.9 MEQ/L-%-% IV SOLN
INTRAVENOUS | Status: DC
  Filled 2020-06-11 (×4): qty 1000

## 2020-06-11 MED ORDER — HYDROMORPHONE HCL 1 MG/ML IJ SOLN
0.5000 mg | INTRAMUSCULAR | Status: DC | PRN
  Administered 2020-06-11: 0.5 mg via INTRAVENOUS
  Filled 2020-06-11: qty 1

## 2020-06-11 MED ORDER — SODIUM CHLORIDE 0.9 % IV SOLN
2.0000 g | Freq: Once | INTRAVENOUS | Status: AC
  Administered 2020-06-11: 2 g via INTRAVENOUS
  Filled 2020-06-11: qty 20

## 2020-06-11 MED ORDER — ACETAMINOPHEN 10 MG/ML IV SOLN
INTRAVENOUS | Status: DC | PRN
  Administered 2020-06-11: 1000 mg via INTRAVENOUS

## 2020-06-11 MED ORDER — PANTOPRAZOLE SODIUM 40 MG IV SOLR
40.0000 mg | Freq: Every day | INTRAVENOUS | Status: DC
  Administered 2020-06-11 – 2020-06-12 (×3): 40 mg via INTRAVENOUS
  Filled 2020-06-11 (×3): qty 40

## 2020-06-11 MED ORDER — MORPHINE SULFATE (PF) 4 MG/ML IV SOLN
4.0000 mg | Freq: Once | INTRAVENOUS | Status: AC
  Administered 2020-06-11: 4 mg via INTRAVENOUS
  Filled 2020-06-11: qty 1

## 2020-06-11 MED ORDER — HYDROMORPHONE HCL 1 MG/ML IJ SOLN
0.5000 mg | INTRAMUSCULAR | Status: DC | PRN
  Administered 2020-06-11 (×3): 0.5 mg via INTRAVENOUS
  Filled 2020-06-11 (×3): qty 1

## 2020-06-11 MED ORDER — ESCITALOPRAM OXALATE 10 MG PO TABS
20.0000 mg | ORAL_TABLET | Freq: Every day | ORAL | Status: DC
  Administered 2020-06-11 – 2020-06-13 (×3): 20 mg via ORAL
  Filled 2020-06-11 (×3): qty 2

## 2020-06-11 MED ORDER — BUPROPION HCL ER (XL) 150 MG PO TB24
300.0000 mg | ORAL_TABLET | Freq: Every day | ORAL | Status: DC
  Administered 2020-06-11 – 2020-06-13 (×3): 300 mg via ORAL
  Filled 2020-06-11 (×3): qty 2

## 2020-06-11 MED ORDER — CLONAZEPAM 1 MG PO TABS
1.0000 mg | ORAL_TABLET | Freq: Two times a day (BID) | ORAL | Status: DC | PRN
  Filled 2020-06-11: qty 1

## 2020-06-11 MED ORDER — SUGAMMADEX SODIUM 500 MG/5ML IV SOLN
INTRAVENOUS | Status: DC | PRN
  Administered 2020-06-11: 200 mg via INTRAVENOUS

## 2020-06-11 MED ORDER — METRONIDAZOLE IN NACL 5-0.79 MG/ML-% IV SOLN
500.0000 mg | Freq: Three times a day (TID) | INTRAVENOUS | Status: DC
  Administered 2020-06-11 – 2020-06-13 (×6): 500 mg via INTRAVENOUS
  Filled 2020-06-11 (×9): qty 100

## 2020-06-11 MED ORDER — POTASSIUM CHLORIDE 10 MEQ/100ML IV SOLN
10.0000 meq | INTRAVENOUS | Status: AC
  Administered 2020-06-11 (×2): 10 meq via INTRAVENOUS
  Filled 2020-06-11 (×2): qty 100

## 2020-06-11 MED ORDER — KETOROLAC TROMETHAMINE 30 MG/ML IJ SOLN: 15.0000 mg | Freq: Four times a day (QID) | INTRAMUSCULAR | Status: DC | PRN

## 2020-06-11 MED ORDER — METRONIDAZOLE IN NACL 5-0.79 MG/ML-% IV SOLN
500.0000 mg | Freq: Once | INTRAVENOUS | Status: DC
  Filled 2020-06-11: qty 100

## 2020-06-11 MED ORDER — ONDANSETRON HCL 4 MG/2ML IJ SOLN
4.0000 mg | Freq: Once | INTRAMUSCULAR | Status: AC
  Administered 2020-06-11: 4 mg via INTRAVENOUS
  Filled 2020-06-11: qty 2

## 2020-06-11 MED ORDER — ONDANSETRON HCL 4 MG/2ML IJ SOLN
INTRAMUSCULAR | Status: DC | PRN
  Administered 2020-06-11: 4 mg via INTRAVENOUS

## 2020-06-11 MED ORDER — ONDANSETRON HCL 4 MG/2ML IJ SOLN: 4.0000 mg | Freq: Once | INTRAMUSCULAR | Status: DC | PRN

## 2020-06-11 MED ORDER — PROPOFOL 10 MG/ML IV BOLUS
INTRAVENOUS | Status: DC | PRN
  Administered 2020-06-11: 160 mg via INTRAVENOUS

## 2020-06-11 SURGICAL SUPPLY — 60 items
ADH SKN CLS APL DERMABOND .7 (GAUZE/BANDAGES/DRESSINGS) ×1
APL PRP STRL LF DISP 70% ISPRP (MISCELLANEOUS) ×1
APL SWBSTK 6 STRL LF DISP (MISCELLANEOUS) ×1
APPLICATOR COTTON TIP 6 STRL (MISCELLANEOUS) ×1 IMPLANT
APPLICATOR COTTON TIP 6IN STRL (MISCELLANEOUS) ×2 IMPLANT
APPLIER CLIP 5 13 M/L LIGAMAX5 (MISCELLANEOUS)
APR CLP MED LRG 5 ANG JAW (MISCELLANEOUS)
BAG SPEC RTRVL LRG 6X4 10 (ENDOMECHANICALS) ×1
BLADE CLIPPER SURG (BLADE) IMPLANT
BLADE SURG SZ11 CARB STEEL (BLADE) ×2 IMPLANT
BULB RESERV EVAC DRAIN JP 100C (MISCELLANEOUS) ×1 IMPLANT
CANISTER SUCT 1200ML W/VALVE (MISCELLANEOUS) ×2 IMPLANT
CHLORAPREP W/TINT 26 (MISCELLANEOUS) ×2 IMPLANT
CLIP APPLIE 5 13 M/L LIGAMAX5 (MISCELLANEOUS) IMPLANT
COVER WAND RF STERILE (DRAPES) ×2 IMPLANT
CUTTER FLEX LINEAR 45M (STAPLE) ×2 IMPLANT
DEFOGGER SCOPE WARMER CLEARIFY (MISCELLANEOUS) ×2 IMPLANT
DERMABOND ADVANCED (GAUZE/BANDAGES/DRESSINGS) ×1
DERMABOND ADVANCED .7 DNX12 (GAUZE/BANDAGES/DRESSINGS) ×1 IMPLANT
DRAIN CHANNEL JP 19F (MISCELLANEOUS) ×1 IMPLANT
ELECT CAUTERY BLADE TIP 2.5 (TIP) ×2
ELECT REM PT RETURN 9FT ADLT (ELECTROSURGICAL) ×2
ELECTRODE CAUTERY BLDE TIP 2.5 (TIP) ×1 IMPLANT
ELECTRODE REM PT RTRN 9FT ADLT (ELECTROSURGICAL) ×1 IMPLANT
GLOVE BIO SURGEON STRL SZ 6.5 (GLOVE) ×2 IMPLANT
GLOVE INDICATOR 7.0 STRL GRN (GLOVE) ×2 IMPLANT
GOWN STRL REUS W/ TWL LRG LVL3 (GOWN DISPOSABLE) ×2 IMPLANT
GOWN STRL REUS W/TWL LRG LVL3 (GOWN DISPOSABLE) ×4
GRASPER SUT TROCAR 14GX15 (MISCELLANEOUS) ×2 IMPLANT
IRRIGATION STRYKERFLOW (MISCELLANEOUS) ×1 IMPLANT
IRRIGATOR STRYKERFLOW (MISCELLANEOUS) ×2
IV NS 1000ML (IV SOLUTION) ×2
IV NS 1000ML BAXH (IV SOLUTION) ×1 IMPLANT
KIT TURNOVER KIT A (KITS) ×2 IMPLANT
KITTNER LAPARASCOPIC 5X40 (MISCELLANEOUS) ×2 IMPLANT
LABEL OR SOLS (LABEL) ×2 IMPLANT
LIGASURE LAP MARYLAND 5MM 37CM (ELECTROSURGICAL) IMPLANT
NEEDLE HYPO 22GX1.5 SAFETY (NEEDLE) ×2 IMPLANT
NS IRRIG 500ML POUR BTL (IV SOLUTION) ×2 IMPLANT
PACK LAP CHOLECYSTECTOMY (MISCELLANEOUS) ×2 IMPLANT
PENCIL ELECTRO HAND CTR (MISCELLANEOUS) ×2 IMPLANT
POUCH SPECIMEN RETRIEVAL 10MM (ENDOMECHANICALS) ×2 IMPLANT
RELOAD STAPLE 45 3.5 BLU ETS (ENDOMECHANICALS) ×1 IMPLANT
RELOAD STAPLE TA45 3.5 REG BLU (ENDOMECHANICALS) ×2 IMPLANT
SCISSORS METZENBAUM CVD 33 (INSTRUMENTS) ×2 IMPLANT
SET TUBE SMOKE EVAC HIGH FLOW (TUBING) ×2 IMPLANT
SHEARS HARMONIC ACE PLUS 36CM (ENDOMECHANICALS) ×2 IMPLANT
SLEEVE ADV FIXATION 5X100MM (TROCAR) ×2 IMPLANT
STRIP CLOSURE SKIN 1/2X4 (GAUZE/BANDAGES/DRESSINGS) ×2 IMPLANT
SUT MNCRL 4-0 (SUTURE) ×2
SUT MNCRL 4-0 27XMFL (SUTURE) ×1
SUT VIC AB 3-0 SH 27 (SUTURE) ×2
SUT VIC AB 3-0 SH 27X BRD (SUTURE) ×1 IMPLANT
SUT VICRYL 0 AB UR-6 (SUTURE) ×2 IMPLANT
SUTURE MNCRL 4-0 27XMF (SUTURE) ×1 IMPLANT
SYS KII FIOS ACCESS ABD 5X100 (TROCAR) ×2
SYSTEM KII FIOS ACES ABD 5X100 (TROCAR) ×1 IMPLANT
TRAY FOLEY MTR SLVR 16FR STAT (SET/KITS/TRAYS/PACK) ×2 IMPLANT
TROCAR 130MM GELPORT  DAV (MISCELLANEOUS) ×2 IMPLANT
TROCAR ADV FIXATION 12X100MM (TROCAR) IMPLANT

## 2020-06-11 NOTE — Anesthesia Procedure Notes (Signed)
Procedure Name: Intubation Date/Time: 06/11/2020 9:40 AM Performed by: Lesle Reek, CRNA Pre-anesthesia Checklist: Patient identified, Emergency Drugs available, Suction available and Patient being monitored Patient Re-evaluated:Patient Re-evaluated prior to induction Oxygen Delivery Method: Circle system utilized Preoxygenation: Pre-oxygenation with 100% oxygen Induction Type: IV induction Laryngoscope Size: Mac and 3 Grade View: Grade I Number of attempts: 1 Placement Confirmation: ETT inserted through vocal cords under direct vision,  positive ETCO2,  CO2 detector and breath sounds checked- equal and bilateral Secured at: 22 cm Tube secured with: Tape

## 2020-06-11 NOTE — H&P (Signed)
Reason for Consult:acute appendicitis Referring Physician: Dr Alfred Levins, Emergency Medicine  Brad Cummings is an 69 y.o. male.  HPI: Brad Cummings is a 69 y.o. male with history of hypertension and hyperlipidemia presents for evaluation of abdominal pain.  Patient reports 2 days of constant severe right lower quadrant abdominal pain associated with nausea and anorexia.  Today he felt warm to the touch but did not check his temperature.  No vomiting, no cough, no chest pain, no shortness of breath.  His pain is currently 10 out of 10.Work up in the ED showed a normal WBC, but CT scan was consistent with acute perforated appendicitis.  General surgery has been consulted in this setting for further evaluation and management.   Past Medical History:  Diagnosis Date  . Anxiety   . Colon polyp   . HLD (hyperlipidemia)   . HTN (hypertension)   . Hx of colonic polyps   . Hypogonadism in male   . Staph infection   . Ventral hernia     Past Surgical History:  Procedure Laterality Date  . COLONOSCOPY W/ BIOPSIES    . knee scope Left 1989  . LAPAROSCOPY     umbilical hernia  . umbilcal hernia repair N/A     Family History  Problem Relation Age of Onset  . Colon polyps Brother   . Colon polyps Father   . Diabetes Father   . Heart disease Father   . Colon cancer Brother 74       removed a foot of his colon  . Heart disease Brother     Social History:  reports that he has never smoked. He has never used smokeless tobacco. He reports current alcohol use of about 8.0 standard drinks of alcohol per week. He reports current drug use. Drug: Marijuana.  Allergies:  Allergies  Allergen Reactions  . Penicillins     Medications: I have reviewed the patient's current medications.  Results for orders placed or performed during the hospital encounter of 06/11/20 (from the past 48 hour(s))  Lipase, blood     Status: None   Collection Time: 06/10/20  5:48 PM  Result Value Ref Range   Lipase  25 11 - 51 U/L    Comment: Performed at [facility], [address] [place], [place] [address]  Comprehensive metabolic panel     Status: Abnormal   Collection Time: 06/10/20  5:48 PM  Result Value Ref Range   Sodium 131 (L) 135 - 145 mmol/L   Potassium 3.2 (L) 3.5 - 5.1 mmol/L   Chloride 94 (L) 98 - 111 mmol/L   CO2 26 22 - 32 mmol/L   Glucose, Bld 118 (H) 70 - 99 mg/dL    Comment: Glucose reference range applies only to samples taken after fasting for at least 8 hours.   BUN 13 8 - 23 mg/dL   Creatinine, Ser 1.34 (H) 0.61 - 1.24 mg/dL   Calcium 9.8 8.9 - 10.3 mg/dL   Total Protein 8.1 6.5 - 8.1 g/dL   Albumin 4.6 3.5 - 5.0 g/dL   AST 22 15 - 41 U/L   ALT 17 0 - 44 U/L   Alkaline Phosphatase 29 (L) 38 - 126 U/L   Total Bilirubin 1.5 (H) 0.3 - 1.2 mg/dL   GFR calc non Af Amer 53 (L) >60 mL/min   GFR calc Af Amer >60 >60 mL/min   Anion gap 11 5 - 15    Comment: Performed at [facility], [address] [place], [place] [address]  CBC     Status: Abnormal   Collection Time: 06/10/20  5:48 PM  Result Value Ref Range   WBC 8.7 4.0 - 10.5 K/uL   RBC 5.23 4.22 - 5.81 MIL/uL   Hemoglobin 17.4 (H) 13.0 - 17.0 g/dL   HCT 47.8 39 - 52 %   MCV 91.4 80.0 - 100.0 fL   MCH 33.3 26.0 - 34.0 pg   MCHC 36.4 (H) 30.0 - 36.0 g/dL   RDW 11.8 11.5 - 15.5 %   Platelets 370 150 - 400 K/uL   nRBC 0.0 0.0 - 0.2 %    Comment: Performed at [facility] [address], [place], [place] [address]    CT ABDOMEN PELVIS W CONTRAST  Result Date: 06/11/2020 CLINICAL DATA:  Nausea and vomiting. Right-sided flank pain and abdominal pain. EXAM: CT ABDOMEN AND PELVIS WITH CONTRAST TECHNIQUE: Multidetector CT imaging of the abdomen and pelvis was performed using the standard protocol following bolus administration of intravenous contrast. CONTRAST:  186mL OMNIPAQUE IOHEXOL 300 MG/ML  SOLN COMPARISON:  None. FINDINGS: Lower chest: The lung bases are clear. The heart size is  normal. Hepatobiliary: The liver is normal. Normal gallbladder.There is no biliary ductal dilation. Pancreas: Normal contours without ductal dilatation. No peripancreatic fluid collection. Spleen: Unremarkable. Adrenals/Urinary Tract: --Adrenal glands: Unremarkable. --Right kidney/ureter: No hydronephrosis or radiopaque kidney stones. --Left kidney/ureter: No hydronephrosis or radiopaque kidney stones. --Urinary bladder: Unremarkable. Stomach/Bowel: --Stomach/Duodenum: No hiatal hernia or other gastric abnormality. Normal duodenal course and caliber. --Small bowel: There is scattered dilated loops of small bowel throughout the abdomen, some of which demonstrate air-fluid levels. There is no distinct transition point. --Colon: There is some wall thickening of the ascending colon and cecum. --Appendix: The appendix is distended at its tip measuring up to approximately 1.5 cm. There is air within the appendix. However, there is a questionable defect in the wall of the distal appendix best visualized at coronal series 5, image 40. there are a few questionable extraluminal pockets of air (axial series 5, image 31) and axial series 2, image 54. Vascular/Lymphatic: Atherosclerotic calcification is present within the non-aneurysmal abdominal aorta, without hemodynamically significant stenosis. --No retroperitoneal lymphadenopathy. --No mesenteric lymphadenopathy. --No pelvic or inguinal lymphadenopathy. Reproductive: Prostate gland is significantly enlarged. Other: There is a small amount of free fluid in the patient's abdomen and pelvis. There is infiltration of the omentum and mesenteric fat with some hyperenhancement of the peritoneal lining. The abdominal wall is normal. Musculoskeletal. No acute displaced fractures. IMPRESSION: 1. Overall findings are suspicious for perforated appendicitis. There appears to be a defect in the tip of the appendix as detailed above. In addition, there is a small volume of free fluid in  the patient's abdomen and pelvis. There are CT findings suggestive of developing peritonitis. There are few pockets of extraluminal gas adjacent to the appendix. 2. Wall thickening of the ascending colon and cecum may, likely reactive 3. Scattered dilated loops of small bowel throughout the abdomen, some of which demonstrate air-fluid levels. There is no distinct transition point. Findings are favored to represent an ileus. 4. Significantly enlarged prostate gland. Aortic Atherosclerosis (ICD10-I70.0). Electronically Signed   By: Constance Holster M.D.   On: 06/11/2020 00:07    Review of Systems  Constitutional: Positive for fever.  Gastrointestinal: Positive for abdominal pain and diarrhea. Negative for nausea and vomiting.   Today's Vitals   06/11/20 0315 06/11/20 0340 06/11/20 0504 06/11/20 0825  BP: Marland Kitchen)  156/95   (!) 148/90  Pulse: (!) 105   (!) 104  Resp: 19   18  Temp: 98.7 F (37.1 C)   98.1 F (36.7 C)  TempSrc:    Oral  SpO2: 98%   97%  Weight:      Height:      PainSc:  6  7  10-Worst pain ever   Body mass index is 25.83 kg/m.  Physical Exam  Vitals reviewed. Constitutional: He appears well-developed.  HENT:  Head: Normocephalic and atraumatic.  Cardiovascular: Regular rhythm. Tachycardia present.  Respiratory: Effort normal. No respiratory distress.  GI: A surgical scar is present. There is abdominal tenderness in the right lower quadrant. There is guarding and tenderness at McBurney's point. There is no rebound.  Genitourinary:    Genitourinary Comments: deferred   Neurological: He is alert.  Skin: Skin is warm and dry.  Psychiatric: His behavior is normal. Mood normal.    Assessment/Plan: --Admit to floor --NPO with IVF --replete potassium --IV Abx --plan for laparoscopic appendectomy   Fredirick Maudlin 06/11/2020, 2:02 AM

## 2020-06-11 NOTE — Anesthesia Preprocedure Evaluation (Signed)
Anesthesia Evaluation  Patient identified by MRN, date of birth, ID band Patient awake    Reviewed: Allergy & Precautions, H&P , NPO status , Patient's Chart, lab work & pertinent test results, reviewed documented beta blocker date and time   Airway Mallampati: II  TM Distance: >3 FB Neck ROM: full    Dental  (+) Teeth Intact   Pulmonary neg pulmonary ROS,    Pulmonary exam normal        Cardiovascular Exercise Tolerance: Good hypertension, On Medications negative cardio ROS Normal cardiovascular exam Rhythm:regular Rate:Normal     Neuro/Psych PSYCHIATRIC DISORDERS Anxiety Depression  Neuromuscular disease    GI/Hepatic negative GI ROS, Neg liver ROS,   Endo/Other  negative endocrine ROS  Renal/GU negative Renal ROS  negative genitourinary   Musculoskeletal   Abdominal   Peds  Hematology negative hematology ROS (+)   Anesthesia Other Findings Past Medical History: No date: Anxiety No date: Colon polyp No date: HLD (hyperlipidemia) No date: HTN (hypertension) No date: Hx of colonic polyps No date: Hypogonadism in male No date: Staph infection No date: Ventral hernia Past Surgical History: No date: COLONOSCOPY W/ BIOPSIES 1989: knee scope; Left No date: LAPAROSCOPY     Comment:  umbilical hernia No date: umbilcal hernia repair; N/A BMI    Body Mass Index: 25.83 kg/m     Reproductive/Obstetrics negative OB ROS                             Anesthesia Physical Anesthesia Plan  ASA: III and emergent  Anesthesia Plan: General ETT   Post-op Pain Management:    Induction:   PONV Risk Score and Plan: 3  Airway Management Planned:   Additional Equipment:   Intra-op Plan:   Post-operative Plan:   Informed Consent: I have reviewed the patients History and Physical, chart, labs and discussed the procedure including the risks, benefits and alternatives for the proposed  anesthesia with the patient or authorized representative who has indicated his/her understanding and acceptance.     Dental Advisory Given  Plan Discussed with: CRNA  Anesthesia Plan Comments:         Anesthesia Quick Evaluation

## 2020-06-11 NOTE — Op Note (Signed)
Operative Note  Laparoscopic Appendectomy   Brad Cummings Date of operation:  06/11/2020  Indications: The patient presented with a history of  abdominal pain. Workup has revealed findings consistent with acute appendicitis.  Pre-operative Diagnosis: Acute appendicitis with generalized peritonitis  Post-operative Diagnosis: Acute gangrenous appendicitis with peritoneal abscess  Surgeon: Fredirick Maudlin, MD  Anesthesia: GETA  Findings: Extensive inflammation of the peritoneum with interloop abscesses and an abscess collection in the cul-de-sac.  Frankly gangrenous appendix.  Estimated Blood Loss: Less than 10 cc         Specimens: appendix         Complications: None immediately apparent  Procedure Details  The patient was seen again in the preop area. The options of surgery versus observation were reviewed with the patient and/or family. The risks of bleeding, infection, recurrence of symptoms, negative laparoscopy, potential for an open procedure, bowel injury, abscess or infection, were all reviewed as well. The patient was taken to Operating Room, identified as Brad Cummings and the procedure verified as laparoscopic appendectomy. A time out was performed and the above information confirmed.  The patient was placed in the supine position and general anesthesia was induced.  Antibiotic prophylaxis was administered and VT E prophylaxis was in place. A Foley catheter was placed by the nursing staff.   The abdomen was prepped and draped in a sterile fashion. A supraumbilical incision was made. A cutdown technique was used to enter the abdominal cavity. Two vicryl stitches were placed on the fascia and a Hasson trocar inserted. Pneumoperitoneum obtained. Two 5 mm ports were placed under direct visualization.  The appendix was identified and found to be gangrenous.  There was pus amongst the loops of bowel and down in the cul-de-sac. The appendix was carefully dissected. The  mesoappendix was divided withHarmonic scalpel. The base of the appendix was dissected out and divided with a standard load Endo GIA.The appendix was placed in a Endo Catch bag and removed via the Hasson port. The right lower quadrant and pelvis was then irrigated with normal saline which was then aspirated multiple times until no more purulent fluid was seen.  A drain was placed in the pelvis and brought out through the left sided trocar site.  The right lower quadrant was inspected there was no sign of bleeding or bowel injury therefore pneumoperitoneum was released, all ports were removed.  The umbilical fascia was closed with 0 Vicryl interrupted sutures and the skin incisions were approximated with subcuticular 4-0 Monocryl. Dermabond was applied, followed by Steri-Strips.  The drain was secured with 3-0 nylon.  The patient tolerated the procedure well and there were no immediately apparent complications. The sponge lap and needle count were correct at the end of the procedure.  The patient was taken to the recovery room in stable condition to be admitted for continued care.   Fredirick Maudlin, MD, FACS

## 2020-06-11 NOTE — ED Provider Notes (Signed)
[facility] Emergency Department Provider Note  ____________________________________________  Time seen: Approximately 1:32 AM  I have reviewed the triage vital signs and the nursing notes.   HISTORY  Chief Complaint Abdominal Pain   HPI Brad Cummings is a 69 y.o. male with history of hypertension and hyperlipidemia presents for evaluation of abdominal pain.  Patient reports 2 days of constant severe right lower quadrant abdominal pain associated with nausea and anorexia.  Today he felt warm to the touch but did not check his temperature.  No vomiting, no cough, no chest pain, no shortness of breath.  His pain is currently 10 out of 10.   Past Medical History:  Diagnosis Date  . Anxiety   . Colon polyp   . HLD (hyperlipidemia)   . HTN (hypertension)   . Hx of colonic polyps   . Hypogonadism in male   . Staph infection   . Ventral hernia     Patient Active Problem List   Diagnosis Date Noted  . Hypogonadism in male 12/30/2019  . Elevated PSA 12/30/2019  . Erectile dysfunction due to arterial insufficiency 12/30/2019  . Depression, major, recurrent, moderate ([place]) 06/29/2019  . H/O elevated lipids 06/29/2019  . H/O: HTN (hypertension) 06/29/2019  . Colon polyps 06/29/2019  . Alcohol abuse 06/02/2015  . Stress due to family tension 06/02/2015  . Bloated abdomen 04/13/2015  . Allergic rhinitis 04/13/2015  . Family history of colon cancer 11/01/2014  . Bowel habit changes 10/21/2014  . Radiculopathy of cervical region 09/22/2014  . Diarrhea 09/16/2014  . Shoulder arthritis 06/09/2013  . Anxiety state 01/22/2011  . MUSCLE SPASM, TRAPEZIUS 01/04/2011  . FATIGUE 01/04/2011  . POLYCYTHEMIA 01/04/2011  . HLD (hyperlipidemia) 11/28/2010  . ESSENTIAL HYPERTENSION, BENIGN 11/28/2010  . HERNIA, VENTRAL 02/25/2009  . History of colonic polyps 02/25/2009    Past Surgical History:  Procedure Laterality Date  . COLONOSCOPY W/ BIOPSIES    . knee  scope Left 1989  . LAPAROSCOPY     umbilical hernia  . umbilcal hernia repair N/A     Prior to Admission medications   Medication Sig Start Date End Date Taking? Authorizing Provider  acetaminophen (TYLENOL) 500 MG tablet Take 500 mg by mouth every 6 (six) hours as needed.      [provider]  atorvastatin (LIPITOR) 40 MG tablet Take 1 tablet (40 mg total) by mouth daily. 10/06/15   Lucille Passy, MD  buPROPion (WELLBUTRIN XL) 300 MG 24 hr tablet  09/22/19   [provider]  clonazePAM Bobbye [place]) 1 MG tablet  09/05/19   [provider]  escitalopram (LEXAPRO) 20 MG tablet Take 1 tablet (20 mg total) by mouth daily. 04/13/15   Lucille Passy, MD  fenofibrate (TRICOR) 48 MG tablet Take 1 tablet (48 mg total) by mouth daily. 10/06/15   Lucille Passy, MD  fluticasone Asencion Islam) 50 MCG/ACT nasal spray USE 2 SPRAYS IN [facility] NOSTRIL EVERY DAY 08/22/15   Lucille Passy, MD  lisinopril-hydrochlorothiazide [facility]) 20-12.5 MG per tablet TAKE ONE TABLET EVERY MORNING 07/21/15   Lucille Passy, MD  neomycin-polymyxin b-dexamethasone (MAXITROL) 3.5-10000-0.1 OINT  08/20/19   [provider]  Probiotic Product (PROBIOTIC DAILY PO) Take 1 capsule by mouth daily.    [provider]  sildenafil (REVATIO) 20 MG tablet 2-5 tabs 1 hour prior to intercourse 12/29/19   Stoioff, Ronda Fairly, MD  testosterone cypionate (DEPOTESTOSTERONE CYPIONATE) 200 MG/ML injection Inject 0.5 mLs (100 mg total) into  the muscle once a week. 04/20/20   Abbie Sons, MD    Allergies Penicillins  Family History  Problem Relation Age of Onset  . Colon polyps Brother   . Colon polyps Father   . Diabetes Father   . Heart disease Father   . Colon cancer Brother 75       removed a foot of his colon  . Heart disease Brother     Social History Social History   Tobacco Use  . Smoking status: Never Smoker  . Smokeless tobacco: Never Used  Substance Use Topics  . Alcohol use: Yes     Alcohol/week: 8.0 standard drinks    Types: 4 Cans of beer, 4 Glasses of wine per week  . Drug use: Yes    Types: Marijuana    Review of Systems  Constitutional: Negative for fever. Eyes: Negative for visual changes. ENT: Negative for sore throat. Neck: No neck pain  Cardiovascular: Negative for chest pain. Respiratory: Negative for shortness of breath. Gastrointestinal: + abdominal pain and nausea. No vomiting or diarrhea. Genitourinary: Negative for dysuria. Musculoskeletal: Negative for back pain. Skin: Negative for rash. Neurological: Negative for headaches, weakness or numbness. Psych: No SI or HI  ____________________________________________   PHYSICAL EXAM:  VITAL SIGNS: ED Triage Vitals [06/10/20 1740]  Enc Vitals Group     BP 120/73     Pulse Rate (!) 112     Resp 16     Temp 98.5 F (36.9 C)     Temp Source Oral     SpO2 93 %     Weight 180 lb (81.6 kg)     Height 5\' 10"  (1.778 m)     Head Circumference      Peak Flow      Pain Score 8     Pain Loc      Pain Edu?      Excl. in [place]?     Constitutional: Alert and oriented. Well appearing and in no apparent distress. HEENT:      Head: Normocephalic and atraumatic.         Eyes: Conjunctivae are normal. Sclera is non-icteric.       Mouth/Throat: Mucous membranes are moist.       Neck: Supple with no signs of meningismus. Cardiovascular: Tachycardic with regular. Respiratory: Normal respiratory effort. Lungs are clear to auscultation bilaterally. No wheezes, crackles, or rhonchi.  Gastrointestinal: patient with signs of peritonitis on exam with rebound and guarding on the right lower quad Musculoskeletal: No edema, cyanosis, or erythema of extremities. Neurologic: Normal speech and language. Face is symmetric. Moving all extremities. No gross focal neurologic deficits are appreciated. Skin: Skin is warm, dry and intact. No rash noted. Psychiatric: Mood and affect are normal. Speech and behavior are  normal.  ____________________________________________   LABS (all labs ordered are listed, but only abnormal results are displayed)  Labs Reviewed  COMPREHENSIVE METABOLIC PANEL - Abnormal; Notable for the following components:      Result Value   Sodium 131 (*)    Potassium 3.2 (*)    Chloride 94 (*)    Glucose, Bld 118 (*)    Creatinine, Ser 1.34 (*)    Alkaline Phosphatase 29 (*)    Total Bilirubin 1.5 (*)    GFR calc non Af Amer 53 (*)    All other components within normal limits  CBC - Abnormal; Notable for the following components:   Hemoglobin 17.4 (*)    MCHC 36.4 (*)  All other components within normal limits  SARS CORONAVIRUS 2 BY RT PCR (HOSPITAL ORDER, [facility])  LIPASE, BLOOD  URINALYSIS, COMPLETE (UACMP) WITH MICROSCOPIC   ____________________________________________  EKG  none  ____________________________________________  RADIOLOGY  I have personally reviewed the images performed during this visit and I agree with the Radiologist's read.   Interpretation by Radiologist:  CT ABDOMEN PELVIS W CONTRAST  Result Date: 06/11/2020 CLINICAL DATA:  Nausea and vomiting. Right-sided flank pain and abdominal pain. EXAM: CT ABDOMEN AND PELVIS WITH CONTRAST TECHNIQUE: Multidetector CT imaging of the abdomen and pelvis was performed using the standard protocol following bolus administration of intravenous contrast. CONTRAST:  166mL OMNIPAQUE IOHEXOL 300 MG/ML  SOLN COMPARISON:  None. FINDINGS: Lower chest: The lung bases are clear. The heart size is normal. Hepatobiliary: The liver is normal. Normal gallbladder.There is no biliary ductal dilation. Pancreas: Normal contours without ductal dilatation. No peripancreatic fluid collection. Spleen: Unremarkable. Adrenals/Urinary Tract: --Adrenal glands: Unremarkable. --Right kidney/ureter: No hydronephrosis or radiopaque kidney stones. --Left kidney/ureter: No hydronephrosis or radiopaque kidney  stones. --Urinary bladder: Unremarkable. Stomach/Bowel: --Stomach/Duodenum: No hiatal hernia or other gastric abnormality. Normal duodenal course and caliber. --Small bowel: There is scattered dilated loops of small bowel throughout the abdomen, some of which demonstrate air-fluid levels. There is no distinct transition point. --Colon: There is some wall thickening of the ascending colon and cecum. --Appendix: The appendix is distended at its tip measuring up to approximately 1.5 cm. There is air within the appendix. However, there is a questionable defect in the wall of the distal appendix best visualized at coronal series 5, image 40. there are a few questionable extraluminal pockets of air (axial series 5, image 31) and axial series 2, image 54. Vascular/Lymphatic: Atherosclerotic calcification is present within the non-aneurysmal abdominal aorta, without hemodynamically significant stenosis. --No retroperitoneal lymphadenopathy. --No mesenteric lymphadenopathy. --No pelvic or inguinal lymphadenopathy. Reproductive: Prostate gland is significantly enlarged. Other: There is a small amount of free fluid in the patient's abdomen and pelvis. There is infiltration of the omentum and mesenteric fat with some hyperenhancement of the peritoneal lining. The abdominal wall is normal. Musculoskeletal. No acute displaced fractures. IMPRESSION: 1. Overall findings are suspicious for perforated appendicitis. There appears to be a defect in the tip of the appendix as detailed above. In addition, there is a small volume of free fluid in the patient's abdomen and pelvis. There are CT findings suggestive of developing peritonitis. There are few pockets of extraluminal gas adjacent to the appendix. 2. Wall thickening of the ascending colon and cecum may, likely reactive 3. Scattered dilated loops of small bowel throughout the abdomen, some of which demonstrate air-fluid levels. There is no distinct transition point. Findings are  favored to represent an ileus. 4. Significantly enlarged prostate gland. Aortic Atherosclerosis (ICD10-I70.0). Electronically Signed   By: Constance Holster M.D.   On: 06/11/2020 00:07     ____________________________________________   PROCEDURES  Procedure(s) performed:yes .1-3 Lead EKG Interpretation Performed by: Rudene Re, MD Authorized by: Rudene Re, MD     Interpretation: normal     ECG rate assessment: normal     Rhythm: sinus rhythm     Ectopy: none     Critical Care performed: yes  CRITICAL CARE Performed by: Rudene Re  ?  Total critical care time: 35 min  Critical care time was exclusive of separately billable procedures and treating other patients.  Critical care was necessary to treat or prevent imminent or life-threatening deterioration.  Critical  care was time spent personally by me on the following activities: development of treatment plan with patient and/or surrogate as well as nursing, discussions with consultants, evaluation of patient's response to treatment, examination of patient, obtaining history from patient or surrogate, ordering and performing treatments and interventions, ordering and review of laboratory studies, ordering and review of radiographic studies, pulse oximetry and re-evaluation of patient's condition.  ____________________________________________   INITIAL IMPRESSION / ASSESSMENT AND PLAN / ED COURSE   69 y.o. male with history of hypertension and hyperlipidemia presents for evaluation of RLQ abdominal pain x 2 days.  Patient is afebrile with a heart rate of 110, normotensive, patient with surgical abdomen on exam with signs of peritonitis, rebound and guarding on the right lower quadrant.  CT confirms perforated appendicitis.  No signs of sepsis.  Patient is allergic to penicillin therefore will give Rocephin and Flagyl IV.  Will consult surgery for admission.  Labs with no leukocytosis.  Patient is not on  blood thinners.  Old medical records reviewed.      _____________________________________________ Please note:  Patient was evaluated in Emergency Department today for the symptoms described in the history of present illness. Patient was evaluated in the context of the global COVID-19 pandemic, which necessitated consideration that the patient might be at risk for infection with the SARS-CoV-2 virus that causes COVID-19. Institutional protocols and algorithms that pertain to the evaluation of patients at risk for COVID-19 are in a state of rapid change based on information released by regulatory bodies including the CDC and federal and state organizations. These policies and algorithms were followed during the patient's care in the ED.  Some ED evaluations and interventions may be delayed as a result of limited staffing during the pandemic.   [place] Controlled Substance Database was reviewed by me. ____________________________________________   FINAL CLINICAL IMPRESSION(S) / ED DIAGNOSES   Final diagnoses:  Acute perforated appendicitis      NEW MEDICATIONS STARTED DURING THIS VISIT:  ED Discharge Orders    None       Note:  This document was prepared using Dragon voice recognition software and may include unintentional dictation errors.    Alfred Levins, [place], MD 06/11/20 [phone]

## 2020-06-11 NOTE — Transfer of Care (Signed)
Immediate Anesthesia Transfer of Care Note  Patient: Brad Cummings  Procedure(s) Performed: APPENDECTOMY LAPAROSCOPIC (N/A Abdomen)  Patient Location: PACU  Anesthesia Type:General  Level of Consciousness: awake and alert   Airway & Oxygen Therapy: Patient Spontanous Breathing  Post-op Assessment: Report given to RN  Post vital signs: Reviewed and stable  Last Vitals:  Vitals Value Taken Time  BP    Temp    Pulse    Resp    SpO2      Last Pain:  Vitals:   06/11/20 0825  TempSrc: Oral  PainSc: 10-Worst pain ever         Complications: No complications documented.

## 2020-06-12 LAB — CBC
HCT: 38 % — ABNORMAL LOW (ref 39.0–52.0)
Hemoglobin: 13.2 g/dL (ref 13.0–17.0)
MCH: 33.4 pg (ref 26.0–34.0)
MCHC: 34.7 g/dL (ref 30.0–36.0)
MCV: 96.2 fL (ref 80.0–100.0)
Platelets: 256 10*3/uL (ref 150–400)
RBC: 3.95 MIL/uL — ABNORMAL LOW (ref 4.22–5.81)
RDW: 12.1 % (ref 11.5–15.5)
WBC: 15.4 10*3/uL — ABNORMAL HIGH (ref 4.0–10.5)
nRBC: 0 % (ref 0.0–0.2)

## 2020-06-12 LAB — BASIC METABOLIC PANEL
Anion gap: 7 (ref 5–15)
BUN: 17 mg/dL (ref 8–23)
CO2: 28 mmol/L (ref 22–32)
Calcium: 8.1 mg/dL — ABNORMAL LOW (ref 8.9–10.3)
Chloride: 101 mmol/L (ref 98–111)
Creatinine, Ser: 1.42 mg/dL — ABNORMAL HIGH (ref 0.61–1.24)
GFR calc Af Amer: 57 mL/min — ABNORMAL LOW (ref >60)
GFR calc non Af Amer: 49 mL/min — ABNORMAL LOW (ref >60)
Glucose, Bld: 129 mg/dL — ABNORMAL HIGH (ref 70–99)
Potassium: 4.8 mmol/L (ref 3.5–5.1)
Sodium: 136 mmol/L (ref 135–145)

## 2020-06-12 LAB — MAGNESIUM: Magnesium: 1.3 mg/dL — ABNORMAL LOW (ref 1.7–2.4)

## 2020-06-12 MED ORDER — MAGNESIUM SULFATE 4 GM/100ML IV SOLN
4.0000 g | Freq: Once | INTRAVENOUS | Status: AC
  Administered 2020-06-12: 4 g via INTRAVENOUS
  Filled 2020-06-12: qty 100

## 2020-06-12 MED ORDER — SODIUM PHOSPHATES 45 MMOLE/15ML IV SOLN
10.0000 mmol | Freq: Once | INTRAVENOUS | Status: AC
  Administered 2020-06-12: 10 mmol via INTRAVENOUS
  Filled 2020-06-12: qty 3.33

## 2020-06-12 NOTE — Anesthesia Postprocedure Evaluation (Signed)
Anesthesia Post Note  Patient: Brad Cummings  Procedure(s) Performed: APPENDECTOMY LAPAROSCOPIC (N/A Abdomen)  Patient location during evaluation: PACU Anesthesia Type: General Level of consciousness: awake and alert Pain management: pain level controlled Vital Signs Assessment: post-procedure vital signs reviewed and stable Respiratory status: spontaneous breathing, nonlabored ventilation, respiratory function stable and patient connected to nasal cannula oxygen Cardiovascular status: blood pressure returned to baseline and stable Postop Assessment: no apparent nausea or vomiting Anesthetic complications: no   No complications documented.   Last Vitals:  Vitals:   06/12/20 1148 06/12/20 1557  BP: 123/85 123/80  Pulse: 94 92  Resp: 19 17  Temp: 36.9 C 36.8 C  SpO2: 99% 98%    Last Pain:  Vitals:   06/12/20 1148  TempSrc: Oral  PainSc:                  Molli Barrows

## 2020-06-12 NOTE — Progress Notes (Signed)
1 Day Post-Op   Subjective/Chief Complaint: Status post laparoscopic appendectomy for perforated gangrenous appendicitis.  He has been tolerating a diet and been afebrile.  He is passing flatus.  He complains that he feels weak.  JP with 165 cc of light pink, slightly cloudy drainage.   Objective: Vital signs in last 24 hours: Temp:  [97.2 F (36.2 C)-98.4 F (36.9 C)] 98.4 F (36.9 C) (06/13 1148) Pulse Rate:  [89-104] 94 (06/13 1148) Resp:  [17-19] 19 (06/13 1148) BP: (113-124)/(75-85) 123/85 (06/13 1148) SpO2:  [95 %-100 %] 99 % (06/13 1148) Last BM Date: 06/10/20  Intake/Output from previous day: 06/12 0701 - 06/13 0700 In: 2939.3 [P.O.:530; I.V.:2409.3] Out: 175 [Drains:165; Blood:10] Intake/Output this shift: No intake/output data recorded.  General appearance: alert and no distress Resp: clear to auscultation bilaterally Cardio: regular rate and rhythm Incision/Wound: Laparoscopic port sites are clean dry and intact with Steri-Strips in place.  Drain with slightly cloudy pink fluid.  Lab Results:  Recent Labs    06/11/20 0457 06/12/20 0443  WBC 14.0* 15.4*  HGB 16.2 13.2  HCT 44.2 38.0*  PLT 357 256   BMET Recent Labs    06/11/20 0457 06/12/20 0443  NA 135 136  K 4.4 4.8  CL 92* 101  CO2 29 28  GLUCOSE 99 129*  BUN 16 17  CREATININE 1.56* 1.42*  CALCIUM 9.3 8.1*   PT/INR No results for input(s): LABPROT, INR in the last 72 hours. ABG No results for input(s): PHART, HCO3 in the last 72 hours.  Invalid input(s): PCO2, PO2  Studies/Results: CT ABDOMEN PELVIS W CONTRAST  Result Date: 06/11/2020 CLINICAL DATA:  Nausea and vomiting. Right-sided flank pain and abdominal pain. EXAM: CT ABDOMEN AND PELVIS WITH CONTRAST TECHNIQUE: Multidetector CT imaging of the abdomen and pelvis was performed using the standard protocol following bolus administration of intravenous contrast. CONTRAST:  143mL OMNIPAQUE IOHEXOL 300 MG/ML  SOLN COMPARISON:  None. FINDINGS:  Lower chest: The lung bases are clear. The heart size is normal. Hepatobiliary: The liver is normal. Normal gallbladder.There is no biliary ductal dilation. Pancreas: Normal contours without ductal dilatation. No peripancreatic fluid collection. Spleen: Unremarkable. Adrenals/Urinary Tract: --Adrenal glands: Unremarkable. --Right kidney/ureter: No hydronephrosis or radiopaque kidney stones. --Left kidney/ureter: No hydronephrosis or radiopaque kidney stones. --Urinary bladder: Unremarkable. Stomach/Bowel: --Stomach/Duodenum: No hiatal hernia or other gastric abnormality. Normal duodenal course and caliber. --Small bowel: There is scattered dilated loops of small bowel throughout the abdomen, some of which demonstrate air-fluid levels. There is no distinct transition point. --Colon: There is some wall thickening of the ascending colon and cecum. --Appendix: The appendix is distended at its tip measuring up to approximately 1.5 cm. There is air within the appendix. However, there is a questionable defect in the wall of the distal appendix best visualized at coronal series 5, image 40. there are a few questionable extraluminal pockets of air (axial series 5, image 31) and axial series 2, image 54. Vascular/Lymphatic: Atherosclerotic calcification is present within the non-aneurysmal abdominal aorta, without hemodynamically significant stenosis. --No retroperitoneal lymphadenopathy. --No mesenteric lymphadenopathy. --No pelvic or inguinal lymphadenopathy. Reproductive: Prostate gland is significantly enlarged. Other: There is a small amount of free fluid in the patient's abdomen and pelvis. There is infiltration of the omentum and mesenteric fat with some hyperenhancement of the peritoneal lining. The abdominal wall is normal. Musculoskeletal. No acute displaced fractures. IMPRESSION: 1. Overall findings are suspicious for perforated appendicitis. There appears to be a defect in the tip of the appendix as  detailed  above. In addition, there is a small volume of free fluid in the patient's abdomen and pelvis. There are CT findings suggestive of developing peritonitis. There are few pockets of extraluminal gas adjacent to the appendix. 2. Wall thickening of the ascending colon and cecum may, likely reactive 3. Scattered dilated loops of small bowel throughout the abdomen, some of which demonstrate air-fluid levels. There is no distinct transition point. Findings are favored to represent an ileus. 4. Significantly enlarged prostate gland. Aortic Atherosclerosis (ICD10-I70.0). Electronically Signed   By: Constance Holster M.D.   On: 06/11/2020 00:07    Anti-infectives: Anti-infectives (From admission, onward)   Start     Dose/Rate Route Frequency Ordered Stop   06/11/20 1800  cefTRIAXone (ROCEPHIN) 1 g in sodium chloride 0.9 % 100 mL IVPB     Discontinue     1 g 200 mL/hr over 30 Minutes Intravenous Every 24 hours 06/11/20 0153     06/11/20 0300  metroNIDAZOLE (FLAGYL) IVPB 500 mg     Discontinue     500 mg 100 mL/hr over 60 Minutes Intravenous Every 8 hours 06/11/20 0153     06/11/20 0115  cefTRIAXone (ROCEPHIN) 2 g in sodium chloride 0.9 % 100 mL IVPB        2 g 200 mL/hr over 30 Minutes Intravenous  Once 06/11/20 0107 06/11/20 0203   06/11/20 0115  metroNIDAZOLE (FLAGYL) IVPB 500 mg  Status:  Discontinued        500 mg 100 mL/hr over 60 Minutes Intravenous  Once 06/11/20 0107 06/11/20 0203      Assessment/Plan: s/p Procedure(s): APPENDECTOMY LAPAROSCOPIC (N/A) --Due to social situation, the patient would like to remain in the hospital today; he also feels weak so we will engage PT --Drain teaching per nursing --Saline lock IV --Continue antibiotics due to frank pus encountered during surgery --Likely discharge home tomorrow  LOS: 1 day    Fredirick Maudlin 06/12/2020

## 2020-06-13 ENCOUNTER — Encounter: Payer: Self-pay | Admitting: General Surgery

## 2020-06-13 LAB — BASIC METABOLIC PANEL
Anion gap: 8 (ref 5–15)
BUN: 17 mg/dL (ref 8–23)
CO2: 27 mmol/L (ref 22–32)
Calcium: 8 mg/dL — ABNORMAL LOW (ref 8.9–10.3)
Chloride: 102 mmol/L (ref 98–111)
Creatinine, Ser: 1.2 mg/dL (ref 0.61–1.24)
GFR calc Af Amer: >60 mL/min (ref >60)
GFR calc non Af Amer: >60 mL/min (ref >60)
Glucose, Bld: 91 mg/dL (ref 70–99)
Potassium: 3.8 mmol/L (ref 3.5–5.1)
Sodium: 137 mmol/L (ref 135–145)

## 2020-06-13 LAB — PHOSPHORUS: Phosphorus: 2.1 mg/dL — ABNORMAL LOW (ref 2.5–4.6)

## 2020-06-13 LAB — MAGNESIUM: Magnesium: 2.1 mg/dL (ref 1.7–2.4)

## 2020-06-13 MED ORDER — SODIUM CHLORIDE 0.9 % IV SOLN
INTRAVENOUS | Status: DC | PRN
  Administered 2020-06-13: 50 mL via INTRAVENOUS

## 2020-06-13 MED ORDER — CIPROFLOXACIN HCL 500 MG PO TABS
500.0000 mg | ORAL_TABLET | Freq: Two times a day (BID) | ORAL | 0 refills | Status: AC
Start: 2020-06-13 — End: 2020-06-20

## 2020-06-13 MED ORDER — OXYCODONE HCL 5 MG PO TABS: 5.0000 mg | ORAL_TABLET | ORAL | 0 refills | Status: DC | PRN

## 2020-06-13 MED ORDER — METRONIDAZOLE 500 MG PO TABS
500.0000 mg | ORAL_TABLET | Freq: Three times a day (TID) | ORAL | 0 refills | Status: AC
Start: 2020-06-13 — End: 2020-06-20

## 2020-06-13 MED ORDER — IBUPROFEN 800 MG PO TABS: 800.0000 mg | ORAL_TABLET | Freq: Three times a day (TID) | ORAL | 0 refills | Status: DC | PRN

## 2020-06-13 NOTE — Discharge Summary (Addendum)
[facility] SURGICAL DISCHARGE SUMMARY  Patient ID: Brad Cummings MRN: [id number] DOB/AGE: 71/17/1951 71 y.o.  Admit date: 06/11/2020 Discharge date: 06/13/2020  Discharge Diagnoses Patient Active Problem List   Diagnosis Date Noted   Acute perforated appendicitis 06/11/2020    Consultants None  Procedures 06/11/2020 Laparoscopic Appendectomy  HPI: Brad Cummings is a 69 y.o. male with history of hypertension and hyperlipidemia presents for evaluation of abdominal pain.  Patient reports 2 days of constant severe right lower quadrant abdominal pain associated with nausea and anorexia.  Today he felt warm to the touch but did not check his temperature.  No vomiting, no cough, no chest pain, no shortness of breath.  His pain is currently 10 out of 10. Work up in the ED showed a normal WBC, but CT scan was consistent with acute perforated appendicitis.  General surgery has been consulted in this setting for further evaluation and management.  Hospital Course: Informed consent was obtained and documented, and patient underwent uneventful laparoscopic appendectomy (Dr Celine Ahr, 06/11/2020).  Post-operatively, patient's pain/symptoms improved/resolved and advancement of patient's diet and ambulation were well-tolerated. The remainder of patient's hospital course was essentially unremarkable, and discharge planning was initiated accordingly with patient safely able to be discharged home with appropriate discharge instructions, antibiotics (cipro + Flagyl x7 days), pain control, and outpatient follow-up after all of his questions were answered to his expressed satisfaction.  I did place home health orders for PT +/- nursing for home.    Discharge Condition: Good   Physical Examination:  Constitutional: Well appearing male, NAD Pulmonary: Normal effort, no respiratory distress Gastrointestinal: Soft, incisional soreness, non-distended, no rebound/guarding, Surgical drain in LLQ  with serosanguinous output Skin: Laparoscopic incisions are CDI with steri-strips, no erythema or drainage    Allergies as of 06/13/2020       Reactions   Penicillins    "Really can't remember as it was from childhood."        Medication List     TAKE these medications    acetaminophen 500 MG tablet Commonly known as: TYLENOL Take 500 mg by mouth every 6 (six) hours as needed.   ALPRAZolam 1 MG tablet Commonly known as: XANAX Take 1 mg by mouth 2 (two) times daily as needed.   atorvastatin 40 MG tablet Commonly known as: LIPITOR Take 1 tablet (40 mg total) by mouth daily.   ciprofloxacin 500 MG tablet Commonly known as: Cipro Take 1 tablet (500 mg total) by mouth 2 (two) times daily for 7 days.   citalopram 20 MG tablet Commonly known as: CELEXA Take 20 mg by mouth daily.   fluticasone 50 MCG/ACT nasal spray Commonly known as: FLONASE USE 2 SPRAYS IN EACH NOSTRIL EVERY DAY   ibuprofen 800 MG tablet Commonly known as: ADVIL Take 1 tablet (800 mg total) by mouth every 8 (eight) hours as needed.   lisinopril-hydrochlorothiazide 20-12.5 MG tablet Commonly known as: ZESTORETIC TAKE ONE TABLET EVERY MORNING   metroNIDAZOLE 500 MG tablet Commonly known as: Flagyl Take 1 tablet (500 mg total) by mouth 3 (three) times daily for 7 days.   oxyCODONE 5 MG immediate release tablet Commonly known as: Oxy IR/ROXICODONE Take 1 tablet (5 mg total) by mouth every 4 (four) hours as needed for severe pain or breakthrough pain.   sildenafil 20 MG tablet Commonly known as: REVATIO 2-5 tabs 1 hour prior to intercourse   testosterone cypionate 200 MG/ML injection Commonly known as: DEPOTESTOSTERONE CYPIONATE Inject 0.5 mLs (100 mg total)  into the muscle once a week.          Follow-up Information     Fredirick Maudlin, MD. Schedule an appointment as soon as possible for a visit in 1 week(s).   Specialty: General Surgery Why: s/p lap appy, has drain Contact  information: [address] [place] [address] [phone]                  Time spent on discharge management including discussion of hospital course, clinical condition, outpatient instructions, prescriptions, and follow up with the patient and members of the medical team: >30 minutes  -- Edison Simon , PA-C [facility]  06/13/2020, 10:47 AM [phone] M-F: 7am - 4pm  I saw and evaluated the patient.  I agree with the above documentation, exam, and plan, which I have edited where appropriate. Fredirick Maudlin  11:44 AM

## 2020-06-13 NOTE — Evaluation (Signed)
Physical Therapy Evaluation Patient Details Name: Brad Cummings MRN: [id number] DOB: 09/16/1950 Today's Date: 06/13/2020   History of Present Illness  Pt is a 69 y.o. male presenting to hospital 6/12 with abdominal pain (constant severe R lower quadrant) x2 days; CT scan consistent with acute perforated appendicitis.  Pt s/p 6/12 laparoscopic appendectomy for perforated gangrenous appendicitis.  PMH includes htn, HLD, anxiety, ventral hernia, cervical radiculopathy.  Clinical Impression  Prior to hospital admission, pt was independent with ambulation.  Pt was walking around room when therapist arrived (pt appearing shaky but no loss of balance noted).  D/t shakiness, trialed pt with RW for ambulation.  Currently pt is SBA with transfers and CGA with ambulation 60 feet with RW.  Pt reporting feeling weak at 20 feet but requesting to keep walking; then after another 10 feet pt reporting feeling dizzy but pt declined to sit down d/t wanting to fight through it and pt walked back to his room and sat on edge of bed (BP 144/79 sitting edge of bed post ambulation).  Pt's nurse and PA Zach notified of above.  Pt would benefit from skilled PT to address noted impairments and functional limitations (see below for any additional details).  Upon hospital discharge, pt would benefit from HHPT and SBA with functional mobility for safety d/t above noted symptoms.    Follow Up Recommendations Home health PT;Supervision for mobility/OOB    Equipment Recommendations  Rolling walker with 5" wheels    Recommendations for Other Services       Precautions / Restrictions Precautions Precautions: Fall Precaution Comments: JP bulb drain Restrictions Weight Bearing Restrictions: No      Mobility  Bed Mobility Overal bed mobility: Modified Independent             General bed mobility comments: Sit to semi-supine in bed without any noted difficulties  Transfers Overall transfer level: Needs  assistance Equipment used: None Transfers: Sit to/from Stand Sit to Stand: Supervision         General transfer comment: mild increased effort to stand from bed with UE support; controlled descent sitting onto bed post ambulation  Ambulation/Gait Ambulation/Gait assistance: Min guard Gait Distance (Feet): 60 Feet Assistive device: Rolling walker (2 wheeled)   Gait velocity: decreased   General Gait Details: partial step through gait pattern; pt appearing shaky but no loss of balance noted  Stairs Stairs:  (Deferred d/t pt's c/o dizzines with ambulation)          Wheelchair Mobility    Modified Rankin (Stroke Patients Only)       Balance Overall balance assessment: Needs assistance Sitting-balance support: No upper extremity supported;Feet supported Sitting balance-Leahy Scale: Normal Sitting balance - Comments: steady sitting reaching outside BOS   Standing balance support: No upper extremity supported;During functional activity Standing balance-Leahy Scale: Good Standing balance comment: pt appearing shaky but no loss of balance ambulating in room (no AD use)                             Pertinent Vitals/Pain Pain Assessment: 0-10 Pain Score: 5  Pain Location: surgical site abdomen Pain Descriptors / Indicators: Sore Pain Intervention(s): Limited activity within patient's tolerance;Monitored during session;Repositioned;Patient requesting pain meds-RN notified  Vitals (HR and O2 on room air) stable and WFL throughout treatment session.    Home Living Family/patient expects to be discharged to:: Private residence Living Arrangements: Non-relatives/Friends Available Help at Discharge: Friend(s) Type of Home: House  Home Access: Stairs to enter Entrance Stairs-Rails: None (can hold onto R side of door) Entrance Stairs-Number of Steps: 2 Home Layout: One level        Prior Function Level of Independence: Independent         Comments: Pt  reports no falls in past 6 months     Hand Dominance        Extremity/Trunk Assessment   Upper Extremity Assessment Upper Extremity Assessment: Generalized weakness    Lower Extremity Assessment Lower Extremity Assessment: Generalized weakness    Cervical / Trunk Assessment Cervical / Trunk Assessment: Normal  Communication   Communication: No difficulties  Cognition Arousal/Alertness: Awake/alert Behavior During Therapy: WFL for tasks assessed/performed Overall Cognitive Status: Within Functional Limits for tasks assessed                                        General Comments   Nursing cleared pt for participation in physical therapy.  Pt agreeable to PT session.    Exercises  Gait training with RW   Assessment/Plan    PT Assessment Patient needs continued PT services  PT Problem List Decreased strength;Decreased balance;Decreased activity tolerance;Decreased mobility;Decreased knowledge of use of DME;Pain;Decreased skin integrity       PT Treatment Interventions DME instruction;Gait training;Stair training;Functional mobility training;Therapeutic activities;Therapeutic exercise;Balance training;Patient/family education    PT Goals (Current goals can be found in the Care Plan section)  Acute Rehab PT Goals Patient Stated Goal: to improve strength and mobility PT Goal Formulation: With patient Time For Goal Achievement: 06/27/20 Potential to Achieve Goals: Good    Frequency Min 2X/week   Barriers to discharge        Co-evaluation               AM-PAC PT "6 Clicks" Mobility  Outcome Measure Help needed turning from your back to your side while in a flat bed without using bedrails?: None Help needed moving from lying on your back to sitting on the side of a flat bed without using bedrails?: None Help needed moving to and from a bed to a chair (including a wheelchair)?: A Little Help needed standing up from a chair using your arms  (e.g., wheelchair or bedside chair)?: A Little Help needed to walk in hospital room?: A Little Help needed climbing 3-5 steps with a railing? : A Little 6 Click Score: 20    End of Session Equipment Utilized During Treatment: Gait belt Activity Tolerance: Other (comment) (Limited d/t c/o dizziness with ambulation) Patient left: in bed;with call bell/phone within reach;with bed alarm set Nurse Communication: Mobility status;Precautions;Other (comment) (pt's c/o dizziness) PT Visit Diagnosis: Other abnormalities of gait and mobility (R26.89);Muscle weakness (generalized) (M62.81)    Time: 2130-8657 PT Time Calculation (min) (ACUTE ONLY): 29 min   Charges:   PT Evaluation $PT Eval Low Complexity: 1 Low PT Treatments $Gait Training: 8-22 mins       Leitha Bleak, PT 06/13/20, 11:16 AM

## 2020-06-13 NOTE — TOC Transition Note (Signed)
Transition of Care [facility]) - CM/SW Discharge Note   Patient Details  Name: Brad Cummings MRN: [id number] Date of Birth: 09/16/1950  [facility] CM/SW Contact:  Elease Hashimoto, LCSW Phone Number: 06/13/2020, 11:04 AM   Clinical Narrative:   Met with pt to discuss discharge needs. He feels somewhat ready to go home but is weak. He feels he does not need HHPT and feels he can get stronger on his own he is very into exercise and keeping in shape. Discussed any equipment needs-he does need a rolling walker and have ordered via [employer]. Pt voiced he has plenty of friends who will be checking on him and will assist him. Pt plans to go home by 2:00 pm, [employer] aware will need to deliver walker by then. No further follow due to DC today.      Barriers to Discharge: No Barriers Identified   Patient Goals and CMS Choice Patient states their goals for this hospitalization and ongoing recovery are:: I feel I can get my strength back on my own. I don;t need a physical therapist CMS Medicare.gov Compare Post Acute Care list provided to:: Patient Choice offered to / list presented to : Patient  Discharge Placement                       Discharge Plan and Services In-house Referral: Clinical Social Work   Post Acute Care Choice: Durable Medical Equipment          DME Arranged: Gilford Rile rolling DME Agency: [employer] Date DME Agency Contacted: 06/13/20 Time DME Agency Contacted: [phone] Representative spoke with at DME Agency: [place] ([place]) Interventions     Readmission Risk Interventions No flowsheet data found.

## 2020-06-13 NOTE — Progress Notes (Signed)
Pt given written discharge instructions and reviewed verbally. Pt given hard script for pain medication. Aware he will need to pick up from pharmacy. Pt educated on drain care and emptying of JP drain. Demonstrated learning. Of note, patient refused home health

## 2020-06-13 NOTE — Progress Notes (Signed)
Pt discharged oof via w/c in stable condition. Pt did receive rolling walker from DME company prior to d/c

## 2020-06-14 LAB — SURGICAL PATHOLOGY

## 2020-06-20 ENCOUNTER — Telehealth: Payer: Self-pay | Admitting: *Deleted

## 2020-06-20 NOTE — Telephone Encounter (Signed)
No additional narcotics.  He is scheduled to see me tomorrow anyway.

## 2020-06-20 NOTE — Telephone Encounter (Signed)
Patient is calling back said he has tried the extra strength tylenol also tried ibuprofen but doesn't take them together, denies fever,chills, nausea and vomiting, he said he was taken the only medication he was prescribe said his stomach is hurting, and still has pain, patient said he has been up moving around. Please call patient and advise.

## 2020-06-20 NOTE — Telephone Encounter (Signed)
Patient called and stated that he had surgery on 06/11/20 by Dr Celine Ahr appendectomy and he is wanting to see if he can get another prescription of oxycodone.

## 2020-06-20 NOTE — Telephone Encounter (Signed)
Spoke with patient to let him know message has been sent to [facility]. He is on the schedule tomorrow and it may be tomorrow before he hears back on a refill of medication but to continue with the Tylenol. Patient verbalized understanding.

## 2020-06-20 NOTE — Telephone Encounter (Signed)
Left message for patient to call office- has he tried Tylenol and ibuprofen together- what has he tried in the past- any fever, chills, nausea or vomiting- is he taking antibiotic- is he up moving around-

## 2020-06-21 ENCOUNTER — Ambulatory Visit (INDEPENDENT_AMBULATORY_CARE_PROVIDER_SITE_OTHER): Payer: Self-pay | Admitting: General Surgery

## 2020-06-21 ENCOUNTER — Other Ambulatory Visit: Payer: Self-pay

## 2020-06-21 ENCOUNTER — Encounter: Payer: Self-pay | Admitting: General Surgery

## 2020-06-21 VITALS — BP 108/70 | HR 97 | Temp 98.1°F | Resp 12 | Ht 71.0 in | Wt 166.2 lb

## 2020-06-21 DIAGNOSIS — Z9049 Acquired absence of other specified parts of digestive tract: Secondary | ICD-10-CM

## 2020-06-21 NOTE — Progress Notes (Signed)
Brad Cummings is here today for a postoperative visit.  He is a 69 year old man who underwent a laparoscopic appendectomy for perforated appendicitis on 10/11/2020.  He was sent home with a drain in place.  He says that he has not emptied his drain since Saturday.  He denies any fevers or chills.  No nausea or vomiting.  He does report a decreased appetite and some loose stools.  He continues to have abdominal discomfort.  He contacted our office yesterday requesting additional narcotics.  He was advised to continue ibuprofen and Tylenol until he is seen here today.  Today, he says that he thinks the medications (referring to the narcotics) "messed with his mind."  Today's Vitals   06/21/20 1016  BP: 108/70  Pulse: 97  Resp: 12  Temp: 98.1 F (36.7 C)  SpO2: 95%  Weight: 166 lb 3.2 oz (75.4 kg)  Height: 5\' 11"  (1.803 m)  PainSc: 5    Body mass index is 23.18 kg/m. Focused abdominal exam: There is a small amount of serosanguineous drainage in his JP bulb.  The laparoscopic trocar sites are still covered with Steri-Strips.  There is no erythema, induration, or drainage present.  There is some bruising surrounding the drain site.  I removed his drain today.  He was encouraged to continue to take alternating doses of ibuprofen and Tylenol.  I recommended that he trying to eat more fiber in his diet, as he says he mostly has been scrambled eggs and meat loaf.  Over-the-counter Pepto-Bismol or Imodium to help with his loose stools.  I gave him clearance to resume his physical activity with the exception that he should continue to avoid lifting anything heavier than 10 pounds for another 10 days or so (a total of 3 weeks from the time of the surgery.  I will see him on an as needed basis.

## 2020-06-21 NOTE — Patient Instructions (Addendum)
Your drain was removed today and a dry gauze was placed on the area. You will need to change this gauze as needed until the area is healed.    Begin taking fiber supplements such as fiber gummies, metamucil, benefiber, etc or start eating foods with high fibers. Take pepto-misbol or imodium as needed.   You may start taking 600mg  of ibuprofen, 3 hours later 650mg  of tylenol, 3 hours later 600mg  of ibuprofen and so on. So you can have something for pain.    Follow up as needed, call the office if you have any questions or concerns.   GENERAL POST-OPERATIVE PATIENT INSTRUCTIONS   WOUND CARE INSTRUCTIONS:  Keep a dry clean dressing on the wound if there is drainage. The initial bandage may be removed after 24 hours.  Once the wound has quit draining you may leave it open to air.  If clothing rubs against the wound or causes irritation and the wound is not draining you may cover it with a dry dressing during the daytime.  Try to keep the wound dry and avoid ointments on the wound unless directed to do so.  If the wound becomes bright red and painful or starts to drain infected material that is not clear, please contact your physician immediately.  If the wound is mildly pink and has a thick firm ridge underneath it, this is normal, and is referred to as a healing ridge.  This will resolve over the next 4-6 weeks.  BATHING: You may shower if you have been informed of this by your surgeon. However, Please do not submerge in a tub, hot tub, or pool until incisions are completely sealed or have been told by your surgeon that you may do so.  DIET:  You may eat any foods that you can tolerate.  It is a good idea to eat a high fiber diet and take in plenty of fluids to prevent constipation.  If you do become constipated you may want to take a mild laxative or take ducolax tablets on a daily basis until your bowel habits are regular.  Constipation can be very uncomfortable, along with straining, after recent  surgery.  ACTIVITY:  You are encouraged to cough and deep breath or use your incentive spirometer if you were given one, every 15-30 minutes when awake.  This will help prevent respiratory complications and low grade fevers post-operatively if you had a general anesthetic.  You may want to hug a pillow when coughing and sneezing to add additional support to the surgical area, if you had abdominal or chest surgery, which will decrease pain during these times.  You are encouraged to walk and engage in light activity for the next two weeks.  You should not lift more than 10 pounds, until 07/02/20 as it could put you at increased risk for complications.  Twenty pounds is roughly equivalent to a plastic bag of groceries. At that time- Listen to your body when lifting, if you have pain when lifting, stop and then try again in a few days. Soreness after doing exercises or activities of daily living is normal as you get back in to your normal routine.  MEDICATIONS:  Try to take narcotic medications and anti-inflammatory medications, such as tylenol, ibuprofen, naprosyn, etc., with food.  This will minimize stomach upset from the medication.  Should you develop nausea and vomiting from the pain medication, or develop a rash, please discontinue the medication and contact your physician.  You should not drive, make  important decisions, or operate machinery when taking narcotic pain medication.  SUNBLOCK Use sun block to incision area over the next year if this area will be exposed to sun. This helps decrease scarring and will allow you avoid a permanent darkened area over your incision.  QUESTIONS:  Please feel free to call our office if you have any questions, and we will be glad to assist you.

## 2020-06-22 NOTE — Telephone Encounter (Signed)
Error

## 2020-07-20 ENCOUNTER — Other Ambulatory Visit: Payer: Self-pay | Admitting: Nurse Practitioner

## 2020-07-20 ENCOUNTER — Other Ambulatory Visit (HOSPITAL_COMMUNITY): Payer: Self-pay | Admitting: Nurse Practitioner

## 2020-07-20 DIAGNOSIS — M542 Cervicalgia: Secondary | ICD-10-CM

## 2020-07-27 ENCOUNTER — Ambulatory Visit: Payer: Self-pay | Admitting: Urology

## 2020-07-31 ENCOUNTER — Emergency Department: Payer: 59

## 2020-07-31 ENCOUNTER — Encounter: Payer: Self-pay | Admitting: Emergency Medicine

## 2020-07-31 ENCOUNTER — Other Ambulatory Visit: Payer: Self-pay

## 2020-07-31 ENCOUNTER — Observation Stay
Admission: EM | Admit: 2020-07-31 | Discharge: 2020-08-01 | Disposition: A | Discharge status: Home / Self Care | Payer: 59 | Attending: Internal Medicine | Admitting: Internal Medicine

## 2020-07-31 DIAGNOSIS — Z8679 Personal history of other diseases of the circulatory system: Secondary | ICD-10-CM

## 2020-07-31 DIAGNOSIS — R55 Syncope and collapse: Secondary | ICD-10-CM | POA: Diagnosis not present

## 2020-07-31 DIAGNOSIS — Z86010 Personal history of colonic polyps: Secondary | ICD-10-CM | POA: Insufficient documentation

## 2020-07-31 DIAGNOSIS — R918 Other nonspecific abnormal finding of lung field: Secondary | ICD-10-CM | POA: Diagnosis not present

## 2020-07-31 DIAGNOSIS — I10 Essential (primary) hypertension: Secondary | ICD-10-CM | POA: Diagnosis not present

## 2020-07-31 DIAGNOSIS — E876 Hypokalemia: Secondary | ICD-10-CM | POA: Diagnosis present

## 2020-07-31 DIAGNOSIS — E785 Hyperlipidemia, unspecified: Secondary | ICD-10-CM | POA: Diagnosis not present

## 2020-07-31 DIAGNOSIS — F418 Other specified anxiety disorders: Secondary | ICD-10-CM | POA: Diagnosis present

## 2020-07-31 DIAGNOSIS — Z20822 Contact with and (suspected) exposure to covid-19: Secondary | ICD-10-CM | POA: Diagnosis not present

## 2020-07-31 DIAGNOSIS — Z79899 Other long term (current) drug therapy: Secondary | ICD-10-CM | POA: Diagnosis not present

## 2020-07-31 DIAGNOSIS — F1010 Alcohol abuse, uncomplicated: Secondary | ICD-10-CM | POA: Diagnosis present

## 2020-07-31 LAB — BASIC METABOLIC PANEL
Anion gap: 10 (ref 5–15)
BUN: 9 mg/dL (ref 8–23)
CO2: 27 mmol/L (ref 22–32)
Calcium: 9.1 mg/dL (ref 8.9–10.3)
Chloride: 96 mmol/L — ABNORMAL LOW (ref 98–111)
Creatinine, Ser: 1.16 mg/dL (ref 0.61–1.24)
GFR calc Af Amer: >60 mL/min (ref >60)
GFR calc non Af Amer: >60 mL/min (ref >60)
Glucose, Bld: 142 mg/dL — ABNORMAL HIGH (ref 70–99)
Potassium: 3.3 mmol/L — ABNORMAL LOW (ref 3.5–5.1)
Sodium: 133 mmol/L — ABNORMAL LOW (ref 135–145)

## 2020-07-31 LAB — CBC
HCT: 44.2 % (ref 39.0–52.0)
Hemoglobin: 15.6 g/dL (ref 13.0–17.0)
MCH: 32.4 pg (ref 26.0–34.0)
MCHC: 35.3 g/dL (ref 30.0–36.0)
MCV: 91.7 fL (ref 80.0–100.0)
Platelets: 460 10*3/uL — ABNORMAL HIGH (ref 150–400)
RBC: 4.82 MIL/uL (ref 4.22–5.81)
RDW: 13.7 % (ref 11.5–15.5)
WBC: 7.4 10*3/uL (ref 4.0–10.5)
nRBC: 0 % (ref 0.0–0.2)

## 2020-07-31 LAB — URINALYSIS, COMPLETE (UACMP) WITH MICROSCOPIC
Bilirubin Urine: NEGATIVE
Glucose, UA: NEGATIVE mg/dL
Hgb urine dipstick: NEGATIVE
Ketones, ur: NEGATIVE mg/dL
Nitrite: NEGATIVE
Protein, ur: NEGATIVE mg/dL
Specific Gravity, Urine: 1.011 (ref 1.005–1.030)
pH: 7 (ref 5.0–8.0)

## 2020-07-31 LAB — SARS CORONAVIRUS 2 BY RT PCR (HOSPITAL ORDER, PERFORMED IN ~~LOC~~ HOSPITAL LAB): SARS Coronavirus 2: NEGATIVE

## 2020-07-31 LAB — BRAIN NATRIURETIC PEPTIDE: B Natriuretic Peptide: 27.9 pg/mL (ref 0.0–100.0)

## 2020-07-31 LAB — TROPONIN I (HIGH SENSITIVITY): Troponin I (High Sensitivity): 10 ng/L (ref <18)

## 2020-07-31 MED ORDER — POTASSIUM CHLORIDE CRYS ER 20 MEQ PO TBCR
40.0000 meq | EXTENDED_RELEASE_TABLET | Freq: Once | ORAL | Status: AC
  Administered 2020-07-31: 40 meq via ORAL
  Filled 2020-07-31: qty 2

## 2020-07-31 MED ORDER — THIAMINE HCL 100 MG PO TABS
100.0000 mg | ORAL_TABLET | Freq: Every day | ORAL | Status: DC
  Administered 2020-08-01: 09:00:00 100 mg via ORAL
  Filled 2020-07-31: qty 1

## 2020-07-31 MED ORDER — HYDROCHLOROTHIAZIDE 12.5 MG PO CAPS
12.5000 mg | ORAL_CAPSULE | Freq: Every day | ORAL | Status: DC
  Administered 2020-08-01: 12.5 mg via ORAL
  Filled 2020-07-31 (×2): qty 1

## 2020-07-31 MED ORDER — FOLIC ACID 1 MG PO TABS
1.0000 mg | ORAL_TABLET | Freq: Every day | ORAL | Status: DC
  Administered 2020-08-01: 1 mg via ORAL
  Filled 2020-07-31: qty 1

## 2020-07-31 MED ORDER — LORAZEPAM 2 MG/ML IJ SOLN: 0.0000 mg | Freq: Two times a day (BID) | INTRAMUSCULAR | Status: DC

## 2020-07-31 MED ORDER — HYDRALAZINE HCL 20 MG/ML IJ SOLN: 5.0000 mg | INTRAMUSCULAR | Status: DC | PRN

## 2020-07-31 MED ORDER — ENOXAPARIN SODIUM 40 MG/0.4ML ~~LOC~~ SOLN
40.0000 mg | SUBCUTANEOUS | Status: DC
  Filled 2020-07-31: qty 0.4

## 2020-07-31 MED ORDER — LORAZEPAM 1 MG PO TABS: 1.0000 mg | ORAL_TABLET | ORAL | Status: DC | PRN

## 2020-07-31 MED ORDER — ACETAMINOPHEN 325 MG PO TABS: 650.0000 mg | ORAL_TABLET | Freq: Four times a day (QID) | ORAL | Status: DC | PRN

## 2020-07-31 MED ORDER — LISINOPRIL 20 MG PO TABS
20.0000 mg | ORAL_TABLET | Freq: Every day | ORAL | Status: DC
  Administered 2020-08-01: 20 mg via ORAL
  Filled 2020-07-31 (×2): qty 1

## 2020-07-31 MED ORDER — CITALOPRAM HYDROBROMIDE 20 MG PO TABS
20.0000 mg | ORAL_TABLET | Freq: Every day | ORAL | Status: DC
  Administered 2020-07-31 – 2020-08-01 (×2): 20 mg via ORAL
  Filled 2020-07-31 (×2): qty 1

## 2020-07-31 MED ORDER — LISINOPRIL-HYDROCHLOROTHIAZIDE 20-12.5 MG PO TABS: 1.0000 | ORAL_TABLET | Freq: Every morning | ORAL | Status: DC

## 2020-07-31 MED ORDER — THIAMINE HCL 100 MG/ML IJ SOLN: 100.0000 mg | Freq: Every day | INTRAMUSCULAR | Status: DC

## 2020-07-31 MED ORDER — LORAZEPAM 2 MG/ML IJ SOLN: 1.0000 mg | INTRAMUSCULAR | Status: DC | PRN

## 2020-07-31 MED ORDER — MECLIZINE HCL 25 MG PO TABS
25.0000 mg | ORAL_TABLET | Freq: Three times a day (TID) | ORAL | Status: DC | PRN
  Filled 2020-07-31: qty 1

## 2020-07-31 MED ORDER — ONDANSETRON HCL 4 MG/2ML IJ SOLN: 4.0000 mg | Freq: Three times a day (TID) | INTRAMUSCULAR | Status: DC | PRN

## 2020-07-31 MED ORDER — ALPRAZOLAM 1 MG PO TABS
1.0000 mg | ORAL_TABLET | Freq: Two times a day (BID) | ORAL | Status: DC | PRN
  Administered 2020-07-31 – 2020-08-01 (×2): 1 mg via ORAL
  Filled 2020-07-31 (×2): qty 1

## 2020-07-31 MED ORDER — ADULT MULTIVITAMIN W/MINERALS CH
1.0000 | ORAL_TABLET | Freq: Every day | ORAL | Status: DC
  Administered 2020-08-01: 09:00:00 1 via ORAL
  Filled 2020-07-31: qty 1

## 2020-07-31 MED ORDER — SODIUM CHLORIDE 0.9% FLUSH
3.0000 mL | Freq: Two times a day (BID) | INTRAVENOUS | Status: DC
  Administered 2020-07-31 – 2020-08-01 (×2): 3 mL via INTRAVENOUS

## 2020-07-31 MED ORDER — LORAZEPAM 2 MG/ML IJ SOLN
0.0000 mg | Freq: Four times a day (QID) | INTRAMUSCULAR | Status: DC
  Administered 2020-08-01: 1 mg via INTRAVENOUS
  Filled 2020-07-31: qty 1

## 2020-07-31 MED ORDER — ACETAMINOPHEN 500 MG PO TABS
1000.0000 mg | ORAL_TABLET | Freq: Once | ORAL | Status: AC
  Administered 2020-07-31: 1000 mg via ORAL
  Filled 2020-07-31: qty 2

## 2020-07-31 MED ORDER — SODIUM CHLORIDE 0.9 % IV SOLN: INTRAVENOUS | Status: DC

## 2020-07-31 MED ORDER — ATORVASTATIN CALCIUM 20 MG PO TABS
40.0000 mg | ORAL_TABLET | Freq: Every day | ORAL | Status: DC
  Administered 2020-08-01: 40 mg via ORAL
  Filled 2020-07-31 (×2): qty 2

## 2020-07-31 NOTE — Plan of Care (Signed)

## 2020-07-31 NOTE — ED Provider Notes (Signed)
[facility] Emergency Department Provider Note   ____________________________________________   First MD Initiated Contact with Patient 07/31/20 1536     (approximate)  I have reviewed the triage vital signs and the nursing notes.   HISTORY  Chief Complaint Dizziness   HP JOSHIAH TRAYNHAM is a 69 y.o. male who reports he was walking his ex-wife to the car when he passed out.  He reports he did not have any premonitory symptoms.  After he woke up on the ground he reports he felt dizzy.  He remains feeling lightheaded.  That is what he meant by dizzy.  He hit his head and has a mild headache and some neck ache as well.  He does not have any other pain elsewhere.  He does not have any chest pain or shortness of breath.  Again he had no symptoms suggesting he would pass out.  Has not had any diarrhea or nausea.  His pain in his head and neck are mild.  They are also diffuse.  Nothing seems to make them better or worse.         Past Medical History:  Diagnosis Date  . Anxiety   . Colon polyp   . HLD (hyperlipidemia)   . HTN (hypertension)   . Hx of colonic polyps   . Hypogonadism in male   . Staph infection   . Ventral hernia     Patient Active Problem List   Diagnosis Date Noted  . Syncope 07/31/2020  . Hypokalemia 07/31/2020  . Acute perforated appendicitis 06/11/2020  . Hypogonadism in male 12/30/2019  . Elevated PSA 12/30/2019  . Erectile dysfunction due to arterial insufficiency 12/30/2019  . Depression, major, recurrent, moderate ([place]) 06/29/2019  . H/O elevated lipids 06/29/2019  . H/O: HTN (hypertension) 06/29/2019  . Colon polyps 06/29/2019  . Alcohol abuse 06/02/2015  . Stress due to family tension 06/02/2015  . Bloated abdomen 04/13/2015  . Allergic rhinitis 04/13/2015  . Family history of colon cancer 11/01/2014  . Bowel habit changes 10/21/2014  . Radiculopathy of cervical region 09/22/2014  . Diarrhea 09/16/2014  . Shoulder  arthritis 06/09/2013  . Anxiety 01/22/2011  . MUSCLE SPASM, TRAPEZIUS 01/04/2011  . FATIGUE 01/04/2011  . POLYCYTHEMIA 01/04/2011  . HLD (hyperlipidemia) 11/28/2010  . ESSENTIAL HYPERTENSION, BENIGN 11/28/2010  . HERNIA, VENTRAL 02/25/2009  . History of colonic polyps 02/25/2009    Past Surgical History:  Procedure Laterality Date  . COLONOSCOPY W/ BIOPSIES    . knee scope Left 1989  . LAPAROSCOPIC APPENDECTOMY N/A 06/11/2020   Procedure: APPENDECTOMY LAPAROSCOPIC;  Surgeon: Fredirick Maudlin, MD;  Location: [facility] ORS;  Service: General;  Laterality: N/A;  . LAPAROSCOPY     umbilical hernia  . umbilcal hernia repair N/A     Prior to Admission medications   Medication Sig Start Date End Date Taking? Authorizing Provider  acetaminophen (TYLENOL) 500 MG tablet Take 500 mg by mouth every 6 (six) hours as needed.      [provider]  ALPRAZolam Duanne Moron) 1 MG tablet Take 1 mg by mouth 2 (two) times daily as needed. 06/06/20   [provider]  atorvastatin (LIPITOR) 40 MG tablet Take 1 tablet (40 mg total) by mouth daily. 10/06/15   Lucille Passy, MD  citalopram (CELEXA) 20 MG tablet Take 20 mg by mouth daily. 06/06/20   [provider]  fluticasone (FLONASE) 50 MCG/ACT nasal spray USE 2 SPRAYS IN [facility] NOSTRIL EVERY DAY 08/22/15   Arnette Norris  M, MD  ibuprofen (ADVIL) 800 MG tablet Take 1 tablet (800 mg total) by mouth every 8 (eight) hours as needed. 06/13/20   Tylene Fantasia, PA-C  lisinopril-hydrochlorothiazide (PRINZIDE,ZESTORETIC) 20-12.5 MG per tablet TAKE ONE TABLET EVERY MORNING 07/21/15   Lucille Passy, MD  sildenafil (REVATIO) 20 MG tablet 2-5 tabs 1 hour prior to intercourse 12/29/19   Stoioff, Ronda Fairly, MD  testosterone cypionate (DEPOTESTOSTERONE CYPIONATE) 200 MG/ML injection Inject 0.5 mLs (100 mg total) into the muscle once a week. 04/20/20   Abbie Sons, MD    Allergies Penicillins  Family History  Problem Relation Age of Onset  . Colon polyps  Brother   . Colon polyps Father   . Diabetes Father   . Heart disease Father   . Colon cancer Brother 21       removed a foot of his colon  . Heart disease Brother     Social History Social History   Tobacco Use  . Smoking status: Never Smoker  . Smokeless tobacco: Never Used  Substance Use Topics  . Alcohol use: Yes    Alcohol/week: 8.0 standard drinks    Types: 4 Cans of beer, 4 Glasses of wine per week  . Drug use: Yes    Types: Marijuana    Review of systems: Constitutional: No fever/chills Eyes: No visual changes. ENT: No sore throat. Cardiovascular: Denies chest pain. Respiratory: Denies shortness of breath. Gastrointestinal: No abdominal pain.  No nausea, no vomiting.  No diarrhea.  No constipation. Genitourinary: Negative for dysuria. Musculoskeletal: Negative for back pain. Skin: Negative for rash. Neurological: Negative for headaches, focal weakness   ____________________________________________   PHYSICAL EXAM:  VITAL SIGNS: ED Triage Vitals  Enc Vitals Group     BP 07/31/20 1219 125/79     Pulse Rate 07/31/20 1219 78     Resp 07/31/20 1219 (!) 10     Temp 07/31/20 1219 97.6 F (36.4 C)     Temp Source 07/31/20 1219 Oral     SpO2 07/31/20 1219 97 %     Weight 07/31/20 1218 166 lb 3.6 oz (75.4 kg)     Height 07/31/20 1218 5\' 11"  (1.803 m)     Head Circumference --      Peak Flow --      Pain Score 07/31/20 1218 5     Pain Loc --      Pain Edu? --      Excl. in [place]? --    Constitutional: Alert and oriented. Well appearing and in no acute distress. Eyes: Conjunctivae are normal. PER. EOMI. Head: Atraumatic. Nose: No congestion/rhinnorhea. Mouth/Throat: Mucous membranes are moist.  Oropharynx non-erythematous. Neck: No stridor.  Mild diffuse cervical spine tenderness to palpation. Cardiovascular: Normal rate, regular rhythm. Grossly normal heart sounds.  Good peripheral circulation. Respiratory: Normal respiratory effort.  No retractions. Lungs  CTAB. Gastrointestinal: Soft and nontender. No distention. No abdominal bruits.  Musculoskeletal: No lower extremity tenderness nor edema.  Neurologic:  Normal speech and language. No gross focal neurologic deficits are appreciated.  Skin:  Skin is warm, dry and intact. No rash noted.   ____________________________________________   LABS (all labs ordered are listed, but only abnormal results are displayed)  Labs Reviewed  BASIC METABOLIC PANEL - Abnormal; Notable for the following components:      Result Value   Sodium 133 (*)    Potassium 3.3 (*)    Chloride 96 (*)    Glucose, Bld 142 (*)  All other components within normal limits  CBC - Abnormal; Notable for the following components:   Platelets 460 (*)    All other components within normal limits  SARS CORONAVIRUS 2 BY RT PCR (HOSPITAL ORDER, [place])  URINALYSIS, COMPLETE (UACMP) WITH MICROSCOPIC  BRAIN NATRIURETIC PEPTIDE  TROPONIN I (HIGH SENSITIVITY)   ____________________________________________  EKG EKG read interpreted by me shows normal sinus rhythm rate of 78 normal axis patient has right bundle branch block but no acute changes.  Right bundle branch block was present in 2012.  ____________________________________________  [place]  ED MD interpretation: Head CT and neck x-ray showed no acute disease per radiology I reviewed the film.  Chest x-ray showed a apparent nodule in the left lung base I reviewed that film as well.  Radiology suggested chest CT to evaluate the lung nodule.  Chest CT was done and shows no lung nodule.  This nodule looks like it was a confluence of shadows or nipple shadow.  Official radiology report(s): CT Head Wo Contrast  Result Date: 07/31/2020 CLINICAL DATA:  69 year old with acute onset of dizziness that began suddenly this morning, who had a syncopal episode thereafter and was found on the floor upon EMS arrival. Initial encounter. EXAM: CT HEAD  WITHOUT CONTRAST CT CERVICAL SPINE WITHOUT CONTRAST TECHNIQUE: Multidetector CT imaging of the head and cervical spine was performed following the standard protocol without intravenous contrast. Multiplanar CT image reconstructions of the cervical spine were also generated. COMPARISON:  CT head 01/18/2011.  No prior cervical spine imaging. FINDINGS: CT HEAD FINDINGS Brain: Mild-to-moderate age related cortical and deep atrophy. 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 Remote stroke involving the medial LEFT cerebellum. Remote stroke involving the LEFT superior cerebellum with encephalomalacia. Mild changes of small vessel disease of the white matter. No mass lesion or midline shift. No acute hemorrhage or hematoma. No extra-axial fluid collections. Vascular: Mild BILATERAL carotid siphon and BILATERAL vertebral artery atherosclerosis. No hyperdense vessel. Skull: No skull fracture or other focal osseous abnormality involving the skull. Sinuses/Orbits: Visualized paranasal sinuses, bilateral mastoid air cells and bilateral middle ear cavities well-aerated. Visualized orbits and globes normal in appearance. Other: None. CT CERVICAL SPINE FINDINGS Alignment: Anatomic posterior alignment. Facet joints anatomically aligned with degenerative changes throughout. Skull base and vertebrae: No fractures identified involving the cervical spine. Coronal reformatted images demonstrate an intact craniocervical junction, intact dens and intact lateral masses throughout. Soft tissues and spinal canal: Borderline multifactorial spinal stenosis at C6-7. No evidence of canal hematoma. Disc levels: Large bridging osteophytes anteriorly at C3-4, C4-5, C5-6 and C6-7. Severe disc space narrowing at C5-6, C6-7 and moderate disc space narrowing at C7-T1. Facet and uncinate hypertrophy account for multilevel foraminal stenoses including moderate to severe LEFT C2-3, severe LEFT and moderate  RIGHT C3-4, severe BILATERAL C4-5, severe RIGHT and moderate LEFT C5-6, severe BILATERAL C6-7, and mild BILATERAL C7-T1. Upper chest: Visualized lung apices clear. Visualized superior mediastinum normal. Other: Mild BILATERAL cervical carotid atherosclerosis. IMPRESSION: BRAIN: 1. No acute intracranial abnormality. 2. 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 Remote strokes involving the LEFT superior cerebellum and the medial LEFT cerebellum. 3. Mild-to-moderate age related generalized atrophy and mild chronic microvascular ischemic changes of the white matter. C-SPINE: 1. No cervical spine fractures identified. 2. Multilevel degenerative disc disease, spondylosis and facet degenerative changes account for multilevel foraminal stenoses detailed above. Borderline multifactorial spinal stenosis at C6- Electronically Signed   By: Marcello Moores  Lawrence M.D.   On: 07/31/2020 14:01   CT Chest Wo Contrast  Result Date: 07/31/2020 CLINICAL DATA:  Abnormal chest x-ray.  Pulmonary nodule. EXAM: CT CHEST WITHOUT CONTRAST TECHNIQUE: Multidetector CT imaging of the chest was performed following the standard protocol without IV contrast. COMPARISON:  Chest x-ray from earlier same day. FINDINGS: Cardiovascular: Heart size is normal. No pericardial effusion. Three-vessel coronary artery calcifications. Aortic atherosclerosis. No thoracic aortic aneurysm. Mediastinum/Nodes: No mass or enlarged lymph nodes are seen within the mediastinum or perihilar regions. Esophagus appears normal. Trachea and central bronchi are unremarkable. Lungs/Pleura: Lungs are clear. No pulmonary nodule or mass. No pneumonia or pulmonary edema. No pleural effusion. Upper Abdomen: Limited images of the upper abdomen are unremarkable. Musculoskeletal: Degenerative spondylosis and ankylosis of the slightly kyphotic thoracolumbar spine, mild to moderate in degree. No acute or suspicious osseous finding. IMPRESSION:  1. No acute findings. 2. Lungs are clear. No pulmonary nodule or mass. The questioned pulmonary nodule within the LEFT lower lobe on today's earlier chest x-ray is presumably superimposition of normal soft tissues or nipple shadow. 3. Coronary artery calcifications. Aortic Atherosclerosis (ICD10-I70.0). Electronically Signed   By: Franki Cabot M.D.   On: 07/31/2020 14:45   CT Cervical Spine Wo Contrast  Result Date: 07/31/2020 CLINICAL DATA:  69 year old with acute onset of dizziness that began suddenly this morning, who had a syncopal episode thereafter and was found on the floor upon EMS arrival. Initial encounter. EXAM: CT HEAD WITHOUT CONTRAST CT CERVICAL SPINE WITHOUT CONTRAST TECHNIQUE: Multidetector CT imaging of the head and cervical spine was performed following the standard protocol without intravenous contrast. Multiplanar CT image reconstructions of the cervical spine were also generated. COMPARISON:  CT head 01/18/2011.  No prior cervical spine imaging. FINDINGS: CT HEAD FINDINGS Brain: Mild-to-moderate age related cortical and deep atrophy. 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 Remote stroke involving the medial LEFT cerebellum. Remote stroke involving the LEFT superior cerebellum with encephalomalacia. Mild changes of small vessel disease of the white matter. No mass lesion or midline shift. No acute hemorrhage or hematoma. No extra-axial fluid collections. Vascular: Mild BILATERAL carotid siphon and BILATERAL vertebral artery atherosclerosis. No hyperdense vessel. Skull: No skull fracture or other focal osseous abnormality involving the skull. Sinuses/Orbits: Visualized paranasal sinuses, bilateral mastoid air cells and bilateral middle ear cavities well-aerated. Visualized orbits and globes normal in appearance. Other: None. CT CERVICAL SPINE FINDINGS Alignment: Anatomic posterior alignment. Facet joints anatomically aligned with degenerative  changes throughout. Skull base and vertebrae: No fractures identified involving the cervical spine. Coronal reformatted images demonstrate an intact craniocervical junction, intact dens and intact lateral masses throughout. Soft tissues and spinal canal: Borderline multifactorial spinal stenosis at C6-7. No evidence of canal hematoma. Disc levels: Large bridging osteophytes anteriorly at C3-4, C4-5, C5-6 and C6-7. Severe disc space narrowing at C5-6, C6-7 and moderate disc space narrowing at C7-T1. Facet and uncinate hypertrophy account for multilevel foraminal stenoses including moderate to severe LEFT C2-3, severe LEFT and moderate RIGHT C3-4, severe BILATERAL C4-5, severe RIGHT and moderate LEFT C5-6, severe BILATERAL C6-7, and mild BILATERAL C7-T1. Upper chest: Visualized lung apices clear. Visualized superior mediastinum normal. Other: Mild BILATERAL cervical carotid atherosclerosis. IMPRESSION: BRAIN: 1. No acute intracranial abnormality. 2. 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 Remote strokes involving the LEFT superior cerebellum and the medial LEFT cerebellum. 3. Mild-to-moderate age related generalized atrophy and mild chronic microvascular ischemic changes of the white matter. C-SPINE: 1. No  cervical spine fractures identified. 2. Multilevel degenerative disc disease, spondylosis and facet degenerative changes account for multilevel foraminal stenoses detailed above. Borderline multifactorial spinal stenosis at C6- Electronically Signed   By: Evangeline Dakin M.D.   On: 07/31/2020 14:01   DG Chest Portable 1 View  Result Date: 07/31/2020 CLINICAL DATA:  Syncope EXAM: PORTABLE CHEST 1 VIEW COMPARISON:  01/17/2011 FINDINGS: 1254 hours. The lungs are clear without focal pneumonia, edema, pneumothorax or pleural effusion. 11 mm nodular opacity identified at the peripheral left lung base. The cardiopericardial silhouette is within normal limits for size.  The visualized bony structures of the thorax show now acute abnormality. Telemetry leads overlie the chest. IMPRESSION: 1. 11 mm nodule at the peripheral left lung base. CT chest without contrast recommended to further evaluate. 2. No other acute cardiopulmonary findings. Electronically Signed   By: Misty Stanley M.D.   On: 07/31/2020 13:46    ____________________________________________   PROCEDURES  Procedure(s) performed (including Critical Care): Critical care time 20 minutes.  This includes evaluating the patient's old records and EKG talking to the patient and then later talking to his ex-wife who is not present in the room at the same time.  She confirmed the patient's history of walking and then passing out with no symptoms.  Additionally I took time to speak to the hospitalist about the patient.  Procedures   ____________________________________________   INITIAL IMPRESSION / ASSESSMENT AND PLAN / ED COURSE  Patient with syncope.  Troponin is still pending at this time.  Hospitalist will come and see the patient.             ____________________________________________   FINAL CLINICAL IMPRESSION(S) / ED DIAGNOSES  Final diagnoses:  Syncope and collapse     ED Discharge Orders    None       Note:  This document was prepared using Dragon voice recognition software and may include unintentional dictation errors.    Nena Polio, MD 07/31/20 [phone]

## 2020-07-31 NOTE — ED Triage Notes (Signed)
Pt presents from home via [facility] with c/o dizziness that began suddenly this am. Pt family reports syncopal episode at home. Pt was found on floor when ems arrived. Unsure of LOC or if pt hit head. Pt currently alert and oriented x4 at this time. Pt c/o dizziness while laying and also c/o photophobia at this time.

## 2020-07-31 NOTE — H&P (Signed)
History and Physical    [name] LFY:[id number] DOB: 1951-12-15 DOA: 07/31/2020  Referring MD/NP/PA:   PCP: Remi Haggard, FNP   Patient coming from:  The patient is coming from home.  At baseline, pt is independent for most of ADL.        Chief Complaint: Syncope  HPI: Brad Cummings is a 69 y.o. male with medical history significant of hypertension, hyperlipidemia, depression, anxiety, hypogonadism, perforated appendicitis, alcohol abuse, who presents with syncope.  Patient states that he started feeling dizzy at home about 11 AM, then he passed out. Per report, pt was found on floor when ems arrived. Pt has photophobia. No unilateral weakness, numbness or tingling in his extremities. No facial droop or slurred speech. Patient denies chest pain, shortness breath, cough, fever or chills. No nausea, vomiting, diarrhea, abdominal pain, symptoms of UTI.   ED Course: pt was found to have WBC 7.4, pending COVID-19 PCR, BNP 27.9, urinalysis (hazy appearance, moderate amount of leukocyte, rare bacteria, WBC 6-10), potassium 3.3, renal function okay, temperature normal, blood pressure 113/71, heart rate 84, RR 12, oxygen saturation 97% on room air. CT head is negative for acute intracranial abnormalities, but showed remote stroke. CT of C-spine is negative for bony fracture, but showed degenerative disc disease. Chest x-ray showed possible 11 mm nodule at the peripheral left lung base, but CT of chest showed that lungs are clear, and the questioned pulmonary nodule within the LEFT lower lobe on chest x-ray is presumably superimposition of normal soft tissues or nipple Shadow. Pt is placed on [facility] for observation.  Review of Systems:   General: no fevers, chills, no body weight gain, has fatigue HEENT: no blurry vision, hearing changes or sore throat Respiratory: no dyspnea, coughing, wheezing CV: no chest pain, no palpitations GI: no nausea, vomiting, abdominal pain, diarrhea,  constipation GU: no dysuria, burning on urination, increased urinary frequency, hematuria  Ext: no leg edema Neuro: no unilateral weakness, numbness, or tingling, no vision change or hearing loss. Has dizziness and syncope Skin: no rash, no skin tear. MSK: No muscle spasm, no deformity, no limitation of range of movement in spin Heme: No easy bruising.  Travel history: No recent long distant travel.  Allergy:  Allergies  Allergen Reactions  . Penicillins     "Really can't remember as it was from childhood."    Past Medical History:  Diagnosis Date  . Anxiety   . Colon polyp   . HLD (hyperlipidemia)   . HTN (hypertension)   . Hx of colonic polyps   . Hypogonadism in male   . Staph infection   . Ventral hernia     Past Surgical History:  Procedure Laterality Date  . COLONOSCOPY W/ BIOPSIES    . knee scope Left 1989  . LAPAROSCOPIC APPENDECTOMY N/A 06/11/2020   Procedure: APPENDECTOMY LAPAROSCOPIC;  Surgeon: Fredirick Maudlin, MD;  Location: [facility] ORS;  Service: General;  Laterality: N/A;  . LAPAROSCOPY     umbilical hernia  . umbilcal hernia repair N/A     Social History:  reports that he has never smoked. He has never used smokeless tobacco. He reports current alcohol use of about 8.0 standard drinks of alcohol per week. He reports current drug use. Drug: Marijuana.  Family History:  Family History  Problem Relation Age of Onset  . Colon polyps Brother   . Colon polyps Father   . Diabetes Father   . Heart disease Father   . Colon cancer Brother  60       removed a foot of his colon  . Heart disease Brother      Prior to Admission medications   Medication Sig Start Date End Date Taking? Authorizing Provider  acetaminophen (TYLENOL) 500 MG tablet Take 500 mg by mouth every 6 (six) hours as needed.      [provider]  ALPRAZolam Duanne Moron) 1 MG tablet Take 1 mg by mouth 2 (two) times daily as needed. 06/06/20   [provider]  atorvastatin (LIPITOR) 40  MG tablet Take 1 tablet (40 mg total) by mouth daily. 10/06/15   Lucille Passy, MD  citalopram (CELEXA) 20 MG tablet Take 20 mg by mouth daily. 06/06/20   [provider]  fluticasone (FLONASE) 50 MCG/ACT nasal spray USE 2 SPRAYS IN EACH NOSTRIL EVERY DAY 08/22/15   Lucille Passy, MD  ibuprofen (ADVIL) 800 MG tablet Take 1 tablet (800 mg total) by mouth every 8 (eight) hours as needed. 06/13/20   Tylene Fantasia, PA-C  lisinopril-hydrochlorothiazide (PRINZIDE,ZESTORETIC) 20-12.5 MG per tablet TAKE ONE TABLET EVERY MORNING 07/21/15   Lucille Passy, MD  sildenafil (REVATIO) 20 MG tablet 2-5 tabs 1 hour prior to intercourse 12/29/19   Stoioff, Ronda Fairly, MD  testosterone cypionate (DEPOTESTOSTERONE CYPIONATE) 200 MG/ML injection Inject 0.5 mLs (100 mg total) into the muscle once a week. 04/20/20   Abbie Sons, MD    Physical Exam: Vitals:   07/31/20 1519 07/31/20 1638 07/31/20 1700 07/31/20 1736  BP: (!) 137/81 (!) 137/81  (!) 155/91  Pulse: 83 84  90  Resp: 12 (!) 11  16  Temp:    98.5 F (36.9 C)  TempSrc:    Oral  SpO2: 98% 97%  98%  Weight:   74.6 kg   Height:   5\' 11"  (1.803 m)    General: Not in acute distress HEENT:       Eyes: PERRL, EOMI, no scleral icterus.       ENT: No discharge from the ears and nose, no pharynx injection, no tonsillar enlargement.        Neck: No JVD, no bruit, no mass felt. Heme: No neck lymph node enlargement. Cardiac: S1/S2, RRR, No murmurs, No gallops or rubs. Respiratory: No rales, wheezing, rhonchi or rubs. GI: Soft, nondistended, nontender, no rebound pain, no organomegaly, BS present. GU: No hematuria Ext: No pitting leg edema bilaterally. 2+DP/PT pulse bilaterally. Musculoskeletal: No joint deformities, No joint redness or warmth, no limitation of ROM in spin. Skin: No rashes.  Neuro: Alert, oriented X3, cranial nerves II-XII grossly intact, moves all extremities normally. Muscle strength 5/5 in all extremities, sensation to light touch  intact. Psych: Patient is not psychotic, no suicidal or hemocidal ideation.  Labs on Admission: I have personally reviewed following labs and imaging studies  CBC: Recent Labs  Lab 07/31/20 1221  WBC 7.4  HGB 15.6  HCT 44.2  MCV 91.7  PLT 193*   Basic Metabolic Panel: Recent Labs  Lab 07/31/20 1221  NA 133*  K 3.3*  CL 96*  CO2 27  GLUCOSE 142*  BUN 9  CREATININE 1.16  CALCIUM 9.1   GFR: Estimated Creatinine Clearance: 62.5 mL/min (by C-G formula based on SCr of 1.16 mg/dL). Liver Function Tests: No results for input(s): AST, ALT, ALKPHOS, BILITOT, PROT, ALBUMIN in the last 168 hours. No results for input(s): LIPASE, AMYLASE in the last 168 hours. No results for input(s): AMMONIA in the last 168 hours. Coagulation Profile:  No results for input(s): INR, PROTIME in the last 168 hours. Cardiac Enzymes: No results for input(s): CKTOTAL, CKMB, CKMBINDEX, TROPONINI in the last 168 hours. BNP (last 3 results) No results for input(s): PROBNP in the last 8760 hours. HbA1C: No results for input(s): HGBA1C in the last 72 hours. CBG: No results for input(s): GLUCAP in the last 168 hours. Lipid Profile: No results for input(s): CHOL, HDL, LDLCALC, TRIG, CHOLHDL, LDLDIRECT in the last 72 hours. Thyroid Function Tests: No results for input(s): TSH, T4TOTAL, FREET4, T3FREE, THYROIDAB in the last 72 hours. Anemia Panel: No results for input(s): VITAMINB12, FOLATE, FERRITIN, TIBC, IRON, RETICCTPCT in the last 72 hours. Urine analysis:    Component Value Date/Time   COLORURINE YELLOW (A) 07/31/2020 1537   APPEARANCEUR HAZY (A) 07/31/2020 1537   LABSPEC 1.011 07/31/2020 1537   PHURINE 7.0 07/31/2020 1537   GLUCOSEU NEGATIVE 07/31/2020 1537   HGBUR NEGATIVE 07/31/2020 1537   BILIRUBINUR NEGATIVE 07/31/2020 1537   KETONESUR NEGATIVE 07/31/2020 1537   PROTEINUR NEGATIVE 07/31/2020 1537   NITRITE NEGATIVE 07/31/2020 1537   LEUKOCYTESUR MODERATE (A) 07/31/2020 1537   Sepsis  Labs: @LABRCNTIP (procalcitonin:4,lacticidven:4) ) Recent Results (from the past 240 hour(s))  SARS Coronavirus 2 by RT PCR (hospital order, performed in [place] hospital lab) Nasopharyngeal Nasopharyngeal Swab     Status: None   Collection Time: 07/31/20  3:37 PM   Specimen: Nasopharyngeal Swab  Result Value Ref Range Status   SARS Coronavirus 2 NEGATIVE NEGATIVE Final    Comment: (NOTE) SARS-CoV-2 target nucleic acids are NOT DETECTED.  The SARS-CoV-2 RNA is generally detectable in upper and lower respiratory specimens during the acute phase of infection. The lowest concentration of SARS-CoV-2 viral copies this assay can detect is 250 copies / mL. A negative result does not preclude SARS-CoV-2 infection and should not be used as the sole basis for treatment or other patient management decisions.  A negative result may occur with improper specimen collection / handling, submission of specimen other than nasopharyngeal swab, presence of viral mutation(s) within the areas targeted by this assay, and inadequate number of viral copies (<250 copies / mL). A negative result must be combined with clinical observations, patient history, and epidemiological information.  Fact Sheet for Patients:   StrictlyIdeas.no  Fact Sheet for Healthcare Providers: BankingDealers.co.za  This test is not yet approved or  cleared by the [place] FDA and has been authorized for detection and/or diagnosis of SARS-CoV-2 by FDA under an Emergency Use Authorization (EUA).  This EUA will remain in effect (meaning this test can be used) for the duration of the COVID-19 declaration under Section 564(b)(1) of the Act, 21 U.S.C. section 360bbb-3(b)(1), unless the authorization is terminated or revoked sooner.  Performed at [facility], [address] [place], [place] [address]      Radiological Exams on Admission: CT Head Wo  Contrast  Result Date: 07/31/2020 CLINICAL DATA:  69 year old with acute onset of dizziness that began suddenly this morning, who had a syncopal episode thereafter and was found on the floor upon EMS arrival. Initial encounter. EXAM: CT HEAD WITHOUT CONTRAST CT CERVICAL SPINE WITHOUT CONTRAST TECHNIQUE: Multidetector CT imaging of the head and cervical spine was performed following the standard protocol without intravenous contrast. Multiplanar CT image reconstructions of the cervical spine were also generated. COMPARISON:  CT head 01/18/2011.  No prior cervical spine imaging. FINDINGS: CT HEAD FINDINGS Brain: Mild-to-moderate age related cortical and deep atrophy. Remote cortical stroke involving the RIGHT posterior parietal lobe  with encephalomalacia. Remote stroke involving the medial LEFT cerebellum. Remote stroke involving the LEFT superior cerebellum with encephalomalacia. Mild changes of small vessel disease of the white matter. No mass lesion or midline shift. No acute hemorrhage or hematoma. No extra-axial fluid collections. Vascular: Mild BILATERAL carotid siphon and BILATERAL vertebral artery atherosclerosis. No hyperdense vessel. Skull: No skull fracture or other focal osseous abnormality involving the skull. Sinuses/Orbits: Visualized paranasal sinuses, bilateral mastoid air cells and bilateral middle ear cavities well-aerated. Visualized orbits and globes normal in appearance. Other: None. CT CERVICAL SPINE FINDINGS Alignment: Anatomic posterior alignment. Facet joints anatomically aligned with degenerative changes throughout. Skull base and vertebrae: No fractures identified involving the cervical spine. Coronal reformatted images demonstrate an intact craniocervical junction, intact dens and intact lateral masses throughout. Soft tissues and spinal canal: Borderline multifactorial spinal stenosis at C6-7. No evidence of canal hematoma. Disc levels: Large bridging osteophytes anteriorly at C3-4,  C4-5, C5-6 and C6-7. Severe disc space narrowing at C5-6, C6-7 and moderate disc space narrowing at C7-T1. Facet and uncinate hypertrophy account for multilevel foraminal stenoses including moderate to severe LEFT C2-3, severe LEFT and moderate RIGHT C3-4, severe BILATERAL C4-5, severe RIGHT and moderate LEFT C5-6, severe BILATERAL C6-7, and mild BILATERAL C7-T1. Upper chest: Visualized lung apices clear. Visualized superior mediastinum normal. Other: Mild BILATERAL cervical carotid atherosclerosis. IMPRESSION: BRAIN: 1. No acute intracranial abnormality. 2. 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 Remote strokes involving the LEFT superior cerebellum and the medial LEFT cerebellum. 3. Mild-to-moderate age related generalized atrophy and mild chronic microvascular ischemic changes of the white matter. C-SPINE: 1. No cervical spine fractures identified. 2. Multilevel degenerative disc disease, spondylosis and facet degenerative changes account for multilevel foraminal stenoses detailed above. Borderline multifactorial spinal stenosis at C6- Electronically Signed   By: Evangeline Dakin M.D.   On: 07/31/2020 14:01   CT Chest Wo Contrast  Result Date: 07/31/2020 CLINICAL DATA:  Abnormal chest x-ray.  Pulmonary nodule. EXAM: CT CHEST WITHOUT CONTRAST TECHNIQUE: Multidetector CT imaging of the chest was performed following the standard protocol without IV contrast. COMPARISON:  Chest x-ray from earlier same day. FINDINGS: Cardiovascular: Heart size is normal. No pericardial effusion. Three-vessel coronary artery calcifications. Aortic atherosclerosis. No thoracic aortic aneurysm. Mediastinum/Nodes: No mass or enlarged lymph nodes are seen within the mediastinum or perihilar regions. Esophagus appears normal. Trachea and central bronchi are unremarkable. Lungs/Pleura: Lungs are clear. No pulmonary nodule or mass. No pneumonia or pulmonary edema. No pleural effusion. Upper  Abdomen: Limited images of the upper abdomen are unremarkable. Musculoskeletal: Degenerative spondylosis and ankylosis of the slightly kyphotic thoracolumbar spine, mild to moderate in degree. No acute or suspicious osseous finding. IMPRESSION: 1. No acute findings. 2. Lungs are clear. No pulmonary nodule or mass. The questioned pulmonary nodule within the LEFT lower lobe on today's earlier chest x-ray is presumably superimposition of normal soft tissues or nipple shadow. 3. Coronary artery calcifications. Aortic Atherosclerosis (ICD10-I70.0). Electronically Signed   By: Franki Cabot M.D.   On: 07/31/2020 14:45   CT Cervical Spine Wo Contrast  Result Date: 07/31/2020 CLINICAL DATA:  69 year old with acute onset of dizziness that began suddenly this morning, who had a syncopal episode thereafter and was found on the floor upon EMS arrival. Initial encounter. EXAM: CT HEAD WITHOUT CONTRAST CT CERVICAL SPINE WITHOUT CONTRAST TECHNIQUE: Multidetector CT imaging of the head and cervical spine was performed following the standard protocol without intravenous contrast. Multiplanar CT image reconstructions of the cervical spine were  also generated. COMPARISON:  CT head 01/18/2011.  No prior cervical spine imaging. FINDINGS: CT HEAD FINDINGS Brain: Mild-to-moderate age related cortical and deep atrophy. 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 Remote stroke involving the medial LEFT cerebellum. Remote stroke involving the LEFT superior cerebellum with encephalomalacia. Mild changes of small vessel disease of the white matter. No mass lesion or midline shift. No acute hemorrhage or hematoma. No extra-axial fluid collections. Vascular: Mild BILATERAL carotid siphon and BILATERAL vertebral artery atherosclerosis. No hyperdense vessel. Skull: No skull fracture or other focal osseous abnormality involving the skull. Sinuses/Orbits: Visualized paranasal sinuses, bilateral  mastoid air cells and bilateral middle ear cavities well-aerated. Visualized orbits and globes normal in appearance. Other: None. CT CERVICAL SPINE FINDINGS Alignment: Anatomic posterior alignment. Facet joints anatomically aligned with degenerative changes throughout. Skull base and vertebrae: No fractures identified involving the cervical spine. Coronal reformatted images demonstrate an intact craniocervical junction, intact dens and intact lateral masses throughout. Soft tissues and spinal canal: Borderline multifactorial spinal stenosis at C6-7. No evidence of canal hematoma. Disc levels: Large bridging osteophytes anteriorly at C3-4, C4-5, C5-6 and C6-7. Severe disc space narrowing at C5-6, C6-7 and moderate disc space narrowing at C7-T1. Facet and uncinate hypertrophy account for multilevel foraminal stenoses including moderate to severe LEFT C2-3, severe LEFT and moderate RIGHT C3-4, severe BILATERAL C4-5, severe RIGHT and moderate LEFT C5-6, severe BILATERAL C6-7, and mild BILATERAL C7-T1. Upper chest: Visualized lung apices clear. Visualized superior mediastinum normal. Other: Mild BILATERAL cervical carotid atherosclerosis. IMPRESSION: BRAIN: 1. No acute intracranial abnormality. 2. 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 Remote strokes involving the LEFT superior cerebellum and the medial LEFT cerebellum. 3. Mild-to-moderate age related generalized atrophy and mild chronic microvascular ischemic changes of the white matter. C-SPINE: 1. No cervical spine fractures identified. 2. Multilevel degenerative disc disease, spondylosis and facet degenerative changes account for multilevel foraminal stenoses detailed above. Borderline multifactorial spinal stenosis at C6- Electronically Signed   By: Evangeline Dakin M.D.   On: 07/31/2020 14:01   DG Chest Portable 1 View  Result Date: 07/31/2020 CLINICAL DATA:  Syncope EXAM: PORTABLE CHEST 1 VIEW COMPARISON:   01/17/2011 FINDINGS: 1254 hours. The lungs are clear without focal pneumonia, edema, pneumothorax or pleural effusion. 11 mm nodular opacity identified at the peripheral left lung base. The cardiopericardial silhouette is within normal limits for size. The visualized bony structures of the thorax show now acute abnormality. Telemetry leads overlie the chest. IMPRESSION: 1. 11 mm nodule at the peripheral left lung base. CT chest without contrast recommended to further evaluate. 2. No other acute cardiopulmonary findings. Electronically Signed   By: Misty Stanley M.D.   On: 07/31/2020 13:46     EKG: Independently reviewed. 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 The differential diagnosis is broad, including vasovagal syncope, TIA, alcohol intoxication, drug abuse, orthostatic status.  - Place on tele bed for obs - Orthostatic vital signs  - MRI-brain - 2d echo - Neuro checks  - IVF: NS 100 cc/h - PT/OT eval and treat  HLD (hyperlipidemia) -Lipitor  Anxiety and depression -Continue home Xanax, Celexa  Alcohol abuse -CIWA protocol  H/O: HTN (hypertension) -IV hydralazine as needed -  Continue present  Hypokalemia -Repleted -Check magnesium level         DVT ppx: SQ Lovenox Code Status: Full code Family Communication: not done, no family member is at bed side.    Disposition Plan:  Anticipate discharge back to previous environment Consults called:  none Admission status: Med-surg bed for obs  Status is: Observation  The patient remains OBS appropriate and will d/c before 2 midnights.  Dispo: The patient is from: Home              Anticipated d/c is to: Home              Anticipated d/c date is: 1 day               Patient currently is not medically stable to d/c.         Date of Service 07/31/2020    Ivor Costa [facility]   If 7PM-7AM, please contact night-coverage [email or web address] 07/31/2020, 6:44 PM

## 2020-08-01 ENCOUNTER — Observation Stay
Admit: 2020-08-01 | Discharge: 2020-08-01 | Disposition: A | Discharge status: Home / Self Care | Payer: 59 | Attending: Internal Medicine | Admitting: Internal Medicine

## 2020-08-01 DIAGNOSIS — R55 Syncope and collapse: Secondary | ICD-10-CM | POA: Diagnosis not present

## 2020-08-01 LAB — ECHOCARDIOGRAM COMPLETE
AR max vel: 2.16 cm2
AV Area VTI: 3.16 cm2
AV Area mean vel: 2.18 cm2
AV Mean grad: 2.5 mmHg
AV Peak grad: 3.9 mmHg
Ao pk vel: 0.99 m/s
Area-P 1/2: 3.06 cm2
Height: 71 in
S' Lateral: 2.06 cm
Weight: 2630.4 oz

## 2020-08-01 LAB — URINE DRUG SCREEN, QUALITATIVE (ARMC ONLY)
Amphetamines, Ur Screen: NOT DETECTED
Barbiturates, Ur Screen: NOT DETECTED
Benzodiazepine, Ur Scrn: POSITIVE — AB
Cannabinoid 50 Ng, Ur ~~LOC~~: POSITIVE — AB
Cocaine Metabolite,Ur ~~LOC~~: NOT DETECTED
MDMA (Ecstasy)Ur Screen: NOT DETECTED
Methadone Scn, Ur: NOT DETECTED
Opiate, Ur Screen: NOT DETECTED
Phencyclidine (PCP) Ur S: NOT DETECTED
Tricyclic, Ur Screen: NOT DETECTED

## 2020-08-01 LAB — BASIC METABOLIC PANEL
Anion gap: 9 (ref 5–15)
BUN: 8 mg/dL (ref 8–23)
CO2: 30 mmol/L (ref 22–32)
Calcium: 9.2 mg/dL (ref 8.9–10.3)
Chloride: 99 mmol/L (ref 98–111)
Creatinine, Ser: 1.2 mg/dL (ref 0.61–1.24)
GFR calc Af Amer: >60 mL/min (ref >60)
GFR calc non Af Amer: >60 mL/min (ref >60)
Glucose, Bld: 99 mg/dL (ref 70–99)
Potassium: 4.1 mmol/L (ref 3.5–5.1)
Sodium: 138 mmol/L (ref 135–145)

## 2020-08-01 LAB — CBC
HCT: 44.3 % (ref 39.0–52.0)
Hemoglobin: 15.6 g/dL (ref 13.0–17.0)
MCH: 32.6 pg (ref 26.0–34.0)
MCHC: 35.2 g/dL (ref 30.0–36.0)
MCV: 92.5 fL (ref 80.0–100.0)
Platelets: 486 10*3/uL — ABNORMAL HIGH (ref 150–400)
RBC: 4.79 MIL/uL (ref 4.22–5.81)
RDW: 13.8 % (ref 11.5–15.5)
WBC: 9.4 10*3/uL (ref 4.0–10.5)
nRBC: 0 % (ref 0.0–0.2)

## 2020-08-01 LAB — MAGNESIUM: Magnesium: 1.6 mg/dL — ABNORMAL LOW (ref 1.7–2.4)

## 2020-08-01 LAB — HIV ANTIBODY (ROUTINE TESTING W REFLEX): HIV Screen 4th Generation wRfx: NONREACTIVE

## 2020-08-01 MED ORDER — MAGNESIUM OXIDE 400 MG PO CAPS: 400.0000 mg | ORAL_CAPSULE | Freq: Two times a day (BID) | ORAL | 0 refills | Status: AC

## 2020-08-01 MED ORDER — THIAMINE HCL 100 MG PO TABS: 100.0000 mg | ORAL_TABLET | Freq: Every day | ORAL | Status: AC

## 2020-08-01 MED ORDER — FOLIC ACID 1 MG PO TABS: 1.0000 mg | ORAL_TABLET | Freq: Every day | ORAL | Status: AC

## 2020-08-01 MED ORDER — MAGNESIUM SULFATE 2 GM/50ML IV SOLN
2.0000 g | Freq: Once | INTRAVENOUS | Status: AC
  Administered 2020-08-01: 2 g via INTRAVENOUS
  Filled 2020-08-01: qty 50

## 2020-08-01 MED ORDER — ADULT MULTIVITAMIN W/MINERALS CH: 1.0000 | ORAL_TABLET | Freq: Every day | ORAL | Status: AC

## 2020-08-01 NOTE — Evaluation (Signed)
Physical Therapy Evaluation Patient Details Name: Brad Cummings MRN: [id number] DOB: 1951/03/12 Today's Date: 08/01/2020   History of Present Illness  Patient is a 69 y.o. male with past medical history significant of hypertension, hyperlipidemia, depression, anxiety, perforated appendicitis, alcohol abuse, who presents with syncope.    Clinical Impression  PT evaluation completed. Before the recent syncopal episode at home, patient reports feeling "sweaty" immediately before syncopal episode. No pre-syncope symptoms are reported with any activity today.  Patient reports he is independent at baseline with all activity and works out at [employer] 3 days per week.  Patient is currently independent with all mobility and ambulated 2 laps around nursing station without difficulty without assistive device. Patient does not report any dizziness during functional activity and ambulation today. As patient appears to be at his baseline level of functional mobility, no PT needs are identified at this time.     Follow Up Recommendations No PT follow up    Equipment Recommendations  None recommended by PT    Recommendations for Other Services       Precautions / Restrictions Precautions Precautions: Fall Restrictions Weight Bearing Restrictions: No      Mobility  Bed Mobility               General bed mobility comments: not observed as patient sitting up on arrival and post session   Transfers Overall transfer level: Independent Equipment used: None             General transfer comment: good safety awareness demonstrated without cueing. no dizziness reported with sit to stand transition.   Ambulation/Gait Ambulation/Gait assistance: Independent Gait Distance (Feet): 360 Feet Assistive device: None Gait Pattern/deviations: WFL(Within Functional Limits);Step-through pattern Gait velocity: normal    General Gait Details: Patient ambulated 2 laps around nursing station without  difficulty and without assistive device. Sp02 97% on room and and pulse 99bpm. No reported dizziness with activity   Stairs            Wheelchair Mobility    Modified Rankin (Stroke Patients Only)       Balance Overall balance assessment: Independent                                           Pertinent Vitals/Pain Pain Assessment: No/denies pain    Home Living Family/patient expects to be discharged to:: Private residence Living Arrangements: Non-relatives/Friends Available Help at Discharge: Friend(s) Type of Home: House Home Access: Stairs to enter   Technical brewer of Steps: 2 Home Layout: One level        Prior Function Level of Independence: Independent         Comments: Independent with all mobility and ADLs      Hand Dominance   Dominant Hand: Left    Extremity/Trunk Assessment   Upper Extremity Assessment Upper Extremity Assessment: Overall WFL for tasks assessed    Lower Extremity Assessment Lower Extremity Assessment: RLE deficits/detail;LLE deficits/detail RLE Deficits / Details: 5/5 dorsiflexion, plantarflexion, knee extension, hip add/abd  RLE Sensation: WNL RLE Coordination: WNL LLE Deficits / Details: 5/5 dorsiflexion, plantarflexion, knee extension, hip add/abd  LLE Sensation: WNL LLE Coordination: WNL       Communication   Communication: No difficulties  Cognition Arousal/Alertness: Awake/alert Behavior During Therapy: WFL for tasks assessed/performed Overall Cognitive Status: Within Functional Limits for tasks assessed  General Comments      Exercises     Assessment/Plan    PT Assessment Patent does not need any further PT services  PT Problem List         PT Treatment Interventions      PT Goals (Current goals can be found in the Care Plan section)  Acute Rehab PT Goals Patient Stated Goal: to go home as soon as possible  PT Goal  Formulation: With patient Time For Goal Achievement: 08/01/20 (PT evaluation and education completed today )    Frequency     Barriers to discharge        Co-evaluation               AM-PAC PT "6 Clicks" Mobility  Outcome Measure Help needed turning from your back to your side while in a flat bed without using bedrails?: None Help needed moving from lying on your back to sitting on the side of a flat bed without using bedrails?: None Help needed moving to and from a bed to a chair (including a wheelchair)?: None Help needed standing up from a chair using your arms (e.g., wheelchair or bedside chair)?: None Help needed to walk in hospital room?: None Help needed climbing 3-5 steps with a railing? : None 6 Click Score: 24    End of Session Equipment Utilized During Treatment: Gait belt Activity Tolerance: Patient tolerated treatment well Patient left: in chair;with call bell/phone within reach Nurse Communication:  (white board up to date ) PT Visit Diagnosis: Repeated falls (R29.6)    Time: 2919-1660 PT Time Calculation (min) (ACUTE ONLY): 16 min   Charges:   PT Evaluation $PT Eval Low Complexity: 1 Low          Minna Merritts, PT, MPT   Percell Locus 08/01/2020, 10:08 AM
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
## 2020-08-01 NOTE — Progress Notes (Signed)
*  PRELIMINARY RESULTS* Echocardiogram 2D Echocardiogram has been performed.  Sherrie Sport 08/01/2020, 11:29 AM

## 2020-08-01 NOTE — Discharge Summary (Signed)
Physician Discharge Summary  Brad Cummings WNI:[id number] DOB: 08/09/1951 DOA: 07/31/2020  PCP: Remi Haggard, FNP  Admit date: 07/31/2020 Discharge date: 08/01/2020  Admitted From: Home Disposition: Home Recommendations for Outpatient Follow-up:  1. Follow up with PCP in 1-2 weeks 2. Please obtain BMP/CBC in one week Home Health none Equipment/Devices: None  Discharge Condition stable CODE STATUS full code Diet recommendation: Cardiac diet Brief/Interim Summary:Brad Cummings is a 69 y.o. male with medical history significant of hypertension, hyperlipidemia, depression, anxiety, hypogonadism, perforated appendicitis, alcohol abuse, who presents with syncope.  Patient states that he started feeling dizzy at home about 11 AM, then he passed out. Per report, pt was found on floor when ems arrived. Pt has photophobia. No unilateral weakness, numbness or tingling in his extremities. No facial droop or slurred speech. Patient denies chest pain, shortness breath, cough, fever or chills. No nausea, vomiting, diarrhea, abdominal pain, symptoms of UTI.   ED Course: pt was found to have WBC 7.4, pending COVID-19 PCR, BNP 27.9, urinalysis (hazy appearance, moderate amount of leukocyte, rare bacteria, WBC 6-10), potassium 3.3, renal function okay, temperature normal, blood pressure 113/71, heart rate 84, RR 12, oxygen saturation 97% on room air. CT head is negative for acute intracranial abnormalities, but showed remote stroke. CT of C-spine is negative for bony fracture, but showed degenerative disc disease. Chest x-ray showed possible 11 mm nodule at the peripheral left lung base, but CT of chest showed that lungs are clear, and the questioned pulmonary nodule within the LEFT lower lobe on chest x-ray is presumably superimposition of normal soft tissues or nipple Shadow. Pt is placed on [facility] for observation.   Discharge Diagnoses:  Principal Problem:   Syncope Active Problems:   HLD  (hyperlipidemia)   Depression with anxiety   Alcohol abuse   H/O: HTN (hypertension)   Hypokalemia   #1 syncope-medical work-up essentially negative for any acute process.  CT head showed remote stroke.  Echocardiogram with normal ejection fraction.  Cardiac enzymes have been negative UA negative chest x-ray no acute findings.  However it was found that his urine drug screen was positive for benzos and THC and he also drinks alcohol and is taking antianxiety medications.  I did advise him not to mix all of these 3 and that it can cause cardiac arrest.  He seems to understand and he is agreeable to stop drinking alcohol or using drugs.  He needs to follow-up with his primary physician as for follow-up of his lung nodules which was found by chest x-ray.  #2 severe hypomagnesemia likely from alcohol abuse repleted.  #3 hyperlipidemia continue Lipitor  #4 history of essential hypertension continue home meds  #5 history of anxiety and depression on Celexa and Xanax  Estimated body mass index is 22.93 kg/m as calculated from the following:   Height as of this encounter: 5\' 11"  (1.803 m).   Weight as of this encounter: 74.6 kg.  Discharge Instructions  Discharge Instructions    Diet - low sodium heart healthy   Complete by: As directed    Increase activity slowly   Complete by: As directed      Allergies as of 08/01/2020      Reactions   Penicillins    "Really can't remember as it was from childhood."      Medication List    TAKE these medications   acetaminophen 500 MG tablet Commonly known as: TYLENOL Take 500 mg by mouth every 6 (six) hours as  needed.   ALPRAZolam 1 MG tablet Commonly known as: XANAX Take 1 mg by mouth 2 (two) times daily as needed.   atorvastatin 40 MG tablet Commonly known as: LIPITOR Take 1 tablet (40 mg total) by mouth daily.   citalopram 20 MG tablet Commonly known as: CELEXA Take 20 mg by mouth daily.   fluticasone 50 MCG/ACT nasal  spray Commonly known as: FLONASE USE 2 SPRAYS IN EACH NOSTRIL EVERY DAY   folic acid 1 MG tablet Commonly known as: FOLVITE Take 1 tablet (1 mg total) by mouth daily. Start taking on: August 02, 2020   lisinopril-hydrochlorothiazide 20-12.5 MG tablet Commonly known as: ZESTORETIC TAKE ONE TABLET EVERY MORNING   multivitamin with minerals Tabs tablet Take 1 tablet by mouth daily. Start taking on: August 02, 2020   testosterone cypionate 200 MG/ML injection Commonly known as: DEPOTESTOSTERONE CYPIONATE Inject 0.5 mLs (100 mg total) into the muscle once a week.   thiamine 100 MG tablet Take 1 tablet (100 mg total) by mouth daily. Start taking on: August 02, 2020       Follow-up Information    Remi Haggard, FNP Follow up.   Specialty: Family Medicine Contact information: [address] [place] [address] [phone]              Allergies  Allergen Reactions  . Penicillins     "Really can't remember as it was from childhood."    Consultations:  None   Procedures/Studies: CT Head Wo Contrast  Result Date: 07/31/2020 CLINICAL DATA:  69 year old with acute onset of dizziness that began suddenly this morning, who had a syncopal episode thereafter and was found on the floor upon EMS arrival. Initial encounter. EXAM: CT HEAD WITHOUT CONTRAST CT CERVICAL SPINE WITHOUT CONTRAST TECHNIQUE: Multidetector CT imaging of the head and cervical spine was performed following the standard protocol without intravenous contrast. Multiplanar CT image reconstructions of the cervical spine were also generated. COMPARISON:  CT head 01/18/2011.  No prior cervical spine imaging. FINDINGS: CT HEAD FINDINGS Brain: Mild-to-moderate age related cortical and deep atrophy. 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 Remote stroke involving the medial LEFT cerebellum. Remote stroke involving the LEFT superior cerebellum with encephalomalacia.  Mild changes of small vessel disease of the white matter. No mass lesion or midline shift. No acute hemorrhage or hematoma. No extra-axial fluid collections. Vascular: Mild BILATERAL carotid siphon and BILATERAL vertebral artery atherosclerosis. No hyperdense vessel. Skull: No skull fracture or other focal osseous abnormality involving the skull. Sinuses/Orbits: Visualized paranasal sinuses, bilateral mastoid air cells and bilateral middle ear cavities well-aerated. Visualized orbits and globes normal in appearance. Other: None. CT CERVICAL SPINE FINDINGS Alignment: Anatomic posterior alignment. Facet joints anatomically aligned with degenerative changes throughout. Skull base and vertebrae: No fractures identified involving the cervical spine. Coronal reformatted images demonstrate an intact craniocervical junction, intact dens and intact lateral masses throughout. Soft tissues and spinal canal: Borderline multifactorial spinal stenosis at C6-7. No evidence of canal hematoma. Disc levels: Large bridging osteophytes anteriorly at C3-4, C4-5, C5-6 and C6-7. Severe disc space narrowing at C5-6, C6-7 and moderate disc space narrowing at C7-T1. Facet and uncinate hypertrophy account for multilevel foraminal stenoses including moderate to severe LEFT C2-3, severe LEFT and moderate RIGHT C3-4, severe BILATERAL C4-5, severe RIGHT and moderate LEFT C5-6, severe BILATERAL C6-7, and mild BILATERAL C7-T1. Upper chest: Visualized lung apices clear. Visualized superior mediastinum normal. Other: Mild BILATERAL cervical carotid atherosclerosis. IMPRESSION: BRAIN: 1. No acute intracranial  abnormality. 2. 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 Remote strokes involving the LEFT superior cerebellum and the medial LEFT cerebellum. 3. Mild-to-moderate age related generalized atrophy and mild chronic microvascular ischemic changes of the white matter. C-SPINE: 1. No cervical spine  fractures identified. 2. Multilevel degenerative disc disease, spondylosis and facet degenerative changes account for multilevel foraminal stenoses detailed above. Borderline multifactorial spinal stenosis at C6- Electronically Signed   By: Evangeline Dakin M.D.   On: 07/31/2020 14:01   CT Chest Wo Contrast  Result Date: 07/31/2020 CLINICAL DATA:  Abnormal chest x-ray.  Pulmonary nodule. EXAM: CT CHEST WITHOUT CONTRAST TECHNIQUE: Multidetector CT imaging of the chest was performed following the standard protocol without IV contrast. COMPARISON:  Chest x-ray from earlier same day. FINDINGS: Cardiovascular: Heart size is normal. No pericardial effusion. Three-vessel coronary artery calcifications. Aortic atherosclerosis. No thoracic aortic aneurysm. Mediastinum/Nodes: No mass or enlarged lymph nodes are seen within the mediastinum or perihilar regions. Esophagus appears normal. Trachea and central bronchi are unremarkable. Lungs/Pleura: Lungs are clear. No pulmonary nodule or mass. No pneumonia or pulmonary edema. No pleural effusion. Upper Abdomen: Limited images of the upper abdomen are unremarkable. Musculoskeletal: Degenerative spondylosis and ankylosis of the slightly kyphotic thoracolumbar spine, mild to moderate in degree. No acute or suspicious osseous finding. IMPRESSION: 1. No acute findings. 2. Lungs are clear. No pulmonary nodule or mass. The questioned pulmonary nodule within the LEFT lower lobe on today's earlier chest x-ray is presumably superimposition of normal soft tissues or nipple shadow. 3. Coronary artery calcifications. Aortic Atherosclerosis (ICD10-I70.0). Electronically Signed   By: Franki Cabot M.D.   On: 07/31/2020 14:45   CT Cervical Spine Wo Contrast  Result Date: 07/31/2020 CLINICAL DATA:  69 year old with acute onset of dizziness that began suddenly this morning, who had a syncopal episode thereafter and was found on the floor upon EMS arrival. Initial encounter. EXAM: CT HEAD  WITHOUT CONTRAST CT CERVICAL SPINE WITHOUT CONTRAST TECHNIQUE: Multidetector CT imaging of the head and cervical spine was performed following the standard protocol without intravenous contrast. Multiplanar CT image reconstructions of the cervical spine were also generated. COMPARISON:  CT head 01/18/2011.  No prior cervical spine imaging. FINDINGS: CT HEAD FINDINGS Brain: Mild-to-moderate age related cortical and deep atrophy. 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 Remote stroke involving the medial LEFT cerebellum. Remote stroke involving the LEFT superior cerebellum with encephalomalacia. Mild changes of small vessel disease of the white matter. No mass lesion or midline shift. No acute hemorrhage or hematoma. No extra-axial fluid collections. Vascular: Mild BILATERAL carotid siphon and BILATERAL vertebral artery atherosclerosis. No hyperdense vessel. Skull: No skull fracture or other focal osseous abnormality involving the skull. Sinuses/Orbits: Visualized paranasal sinuses, bilateral mastoid air cells and bilateral middle ear cavities well-aerated. Visualized orbits and globes normal in appearance. Other: None. CT CERVICAL SPINE FINDINGS Alignment: Anatomic posterior alignment. Facet joints anatomically aligned with degenerative changes throughout. Skull base and vertebrae: No fractures identified involving the cervical spine. Coronal reformatted images demonstrate an intact craniocervical junction, intact dens and intact lateral masses throughout. Soft tissues and spinal canal: Borderline multifactorial spinal stenosis at C6-7. No evidence of canal hematoma. Disc levels: Large bridging osteophytes anteriorly at C3-4, C4-5, C5-6 and C6-7. Severe disc space narrowing at C5-6, C6-7 and moderate disc space narrowing at C7-T1. Facet and uncinate hypertrophy account for multilevel foraminal stenoses including moderate to severe LEFT C2-3, severe LEFT and moderate  RIGHT C3-4, severe BILATERAL C4-5,  severe RIGHT and moderate LEFT C5-6, severe BILATERAL C6-7, and mild BILATERAL C7-T1. Upper chest: Visualized lung apices clear. Visualized superior mediastinum normal. Other: Mild BILATERAL cervical carotid atherosclerosis. IMPRESSION: BRAIN: 1. No acute intracranial abnormality. 2. 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 Remote strokes involving the LEFT superior cerebellum and the medial LEFT cerebellum. 3. Mild-to-moderate age related generalized atrophy and mild chronic microvascular ischemic changes of the white matter. C-SPINE: 1. No cervical spine fractures identified. 2. Multilevel degenerative disc disease, spondylosis and facet degenerative changes account for multilevel foraminal stenoses detailed above. Borderline multifactorial spinal stenosis at C6- Electronically Signed   By: Evangeline Dakin M.D.   On: 07/31/2020 14:01   DG Chest Portable 1 View  Result Date: 07/31/2020 CLINICAL DATA:  Syncope EXAM: PORTABLE CHEST 1 VIEW COMPARISON:  01/17/2011 FINDINGS: 1254 hours. The lungs are clear without focal pneumonia, edema, pneumothorax or pleural effusion. 11 mm nodular opacity identified at the peripheral left lung base. The cardiopericardial silhouette is within normal limits for size. The visualized bony structures of the thorax show now acute abnormality. Telemetry leads overlie the chest. IMPRESSION: 1. 11 mm nodule at the peripheral left lung base. CT chest without contrast recommended to further evaluate. 2. No other acute cardiopulmonary findings. Electronically Signed   By: Misty Stanley M.D.   On: 07/31/2020 13:46   ECHOCARDIOGRAM COMPLETE  Result Date: 08/01/2020    ECHOCARDIOGRAM REPORT   Patient Name:   Brad Cummings Date of Exam: 08/01/2020 Medical Rec #:  [id number]      Height:       71.0 in Accession #:    [id number]     Weight:       164.4 lb Date of Birth:  1951/07/15      BSA:          1.940 m  Patient Age:    24 years       BP:           137/94 mmHg Patient Gender: M              HR:           90 bpm. Exam Location:  [facility] Procedure: 2D Echo, Cardiac Doppler and Color Doppler Indications:     Syncope 780.2  History:         Patient has no prior history of Echocardiogram examinations.                  Risk Factors:Hypertension and Dyslipidemia. Anxiety.  Sonographer:     Sherrie Sport RDCS (AE) Referring Phys:  Baker Janus Soledad Gerlach NIU Diagnosing Phys: Bartholome Bill MD  Sonographer Comments: No parasternal window and suboptimal apical window. IMPRESSIONS  1. Left ventricular ejection fraction, by estimation, is 65 to 70%. The left ventricle has normal function. The left ventricle has no regional wall motion abnormalities. Left ventricular diastolic parameters were normal.  2. Right ventricular systolic function is normal. The right ventricular size is normal.  3. The mitral valve is grossly normal. Trivial mitral valve regurgitation.  4. The aortic valve was not well visualized. Aortic valve regurgitation is trivial. FINDINGS  Left Ventricle: Left ventricular ejection fraction, by estimation, is 65 to 70%. The left ventricle has normal function. The left ventricle has no regional wall motion abnormalities. The left ventricular internal cavity size was normal in size. There is  no left ventricular hypertrophy. Left ventricular diastolic parameters were normal. Right Ventricle: The right ventricular size is normal. No  increase in right ventricular wall thickness. Right ventricular systolic function is normal. Left Atrium: Left atrial size was normal in size. Right Atrium: Right atrial size was normal in size. Pericardium: There is no evidence of pericardial effusion. Mitral Valve: The mitral valve is grossly normal. Trivial mitral valve regurgitation. Tricuspid Valve: The tricuspid valve is not well visualized. Tricuspid valve regurgitation is trivial. Aortic Valve: The aortic valve was not well visualized. Aortic valve  regurgitation is trivial. Aortic valve mean gradient measures 2.5 mmHg. Aortic valve peak gradient measures 3.9 mmHg. Aortic valve area, by VTI measures 3.16 cm. Pulmonic Valve: The pulmonic valve was not well visualized. Pulmonic valve regurgitation is trivial. Aorta: The aortic root is normal in size and structure. IAS/Shunts: The interatrial septum was not assessed.  LEFT VENTRICLE PLAX 2D LVIDd:         3.35 cm  Diastology LVIDs:         2.06 cm  LV e' lateral:   6.53 cm/s LV PW:         1.05 cm  LV E/e' lateral: 11.1 LV IVS:        0.78 cm  LV e' medial:    5.98 cm/s LVOT diam:     2.00 cm  LV E/e' medial:  12.1 LV SV:         52 LV SV Index:   27 LVOT Area:     3.14 cm  RIGHT VENTRICLE RV S prime:     9.79 cm/s TAPSE (M-mode): 2.6 cm LEFT ATRIUM           Index       RIGHT ATRIUM           Index LA diam:      2.70 cm 1.39 cm/m  RA Area:     10.30 cm LA Vol (A2C): 51.4 ml 26.49 ml/m RA Volume:   21.20 ml  10.93 ml/m LA Vol (A4C): 23.3 ml 12.01 ml/m  AORTIC VALVE                   PULMONIC VALVE AV Area (Vmax):    2.16 cm    PV Vmax:        1.19 m/s AV Area (Vmean):   2.18 cm    PV Peak grad:   5.7 mmHg AV Area (VTI):     3.16 cm    RVOT Peak grad: 8 mmHg AV Vmax:           98.85 cm/s AV Vmean:          68.850 cm/s AV VTI:            0.166 m AV Peak Grad:      3.9 mmHg AV Mean Grad:      2.5 mmHg LVOT Vmax:         68.00 cm/s LVOT Vmean:        47.700 cm/s LVOT VTI:          0.167 m LVOT/AV VTI ratio: 1.01 MITRAL VALVE MV Area (PHT): 3.06 cm     SHUNTS MV Decel Time: 248 msec     Systemic VTI:  0.17 m MV E velocity: 72.30 cm/s   Systemic Diam: 2.00 cm MV A velocity: 105.00 cm/s MV E/A ratio:  0.69 Bartholome Bill MD Electronically signed by Bartholome Bill MD Signature Date/Time: 08/01/2020/1:18:10 PM    Final     (Echo, Carotid, EGD, Colonoscopy, ERCP)    Subjective:  Patient resting in bed ambulated  in the room without any dizziness.  He is anxious to go home today he already has someone come to  pick him before I even discharge him.  He denies any chest pain shortness of breath headache changes with his vision or localized weakness. Discharge Exam: Vitals:   08/01/20 0742 08/01/20 1158  BP: (!) 137/94 135/75  Pulse: 90 87  Resp: 18 16  Temp: 98.4 F (36.9 C) 98.5 F (36.9 C)  SpO2: 99% 98%   Vitals:   08/01/20 0013 08/01/20 0434 08/01/20 0742 08/01/20 1158  BP: 127/83 (!) 141/93 (!) 137/94 135/75  Pulse: 83 72 90 87  Resp: 15 16 18 16   Temp: 98 F (36.7 C) 98.4 F (36.9 C) 98.4 F (36.9 C) 98.5 F (36.9 C)  TempSrc: Oral Oral Oral Oral  SpO2: 99% 100% 99% 98%  Weight:      Height:        General: Pt is alert, awake, not in acute distress Cardiovascular: RRR, S1/S2 +, no rubs, no gallops Respiratory: CTA bilaterally, no wheezing, no rhonchi Abdominal: Soft, NT, ND, bowel sounds + Extremities: no edema, no cyanosis    The results of significant diagnostics from this hospitalization (including imaging, microbiology, ancillary and laboratory) are listed below for reference.     Microbiology: Recent Results (from the past 240 hour(s))  SARS Coronavirus 2 by RT PCR (hospital order, performed in [facility] hospital lab) Nasopharyngeal Nasopharyngeal Swab     Status: None   Collection Time: 07/31/20  3:37 PM   Specimen: Nasopharyngeal Swab  Result Value Ref Range Status   SARS Coronavirus 2 NEGATIVE NEGATIVE Final    Comment: (NOTE) SARS-CoV-2 target nucleic acids are NOT DETECTED.  The SARS-CoV-2 RNA is generally detectable in upper and lower respiratory specimens during the acute phase of infection. The lowest concentration of SARS-CoV-2 viral copies this assay can detect is 250 copies / mL. A negative result does not preclude SARS-CoV-2 infection and should not be used as the sole basis for treatment or other patient management decisions.  A negative result may occur with improper specimen collection / handling, submission of specimen other than  nasopharyngeal swab, presence of viral mutation(s) within the areas targeted by this assay, and inadequate number of viral copies (<250 copies / mL). A negative result must be combined with clinical observations, patient history, and epidemiological information.  Fact Sheet for Patients:   StrictlyIdeas.no  Fact Sheet for Healthcare Providers: BankingDealers.co.za  This test is not yet approved or  cleared by the [place] FDA and has been authorized for detection and/or diagnosis of SARS-CoV-2 by FDA under an Emergency Use Authorization (EUA).  This EUA will remain in effect (meaning this test can be used) for the duration of the COVID-19 declaration under Section 564(b)(1) of the Act, 21 U.S.C. section 360bbb-3(b)(1), unless the authorization is terminated or revoked sooner.  Performed at [facility], [address] [place], [place] [address]      Labs: BNP (last 3 results) Recent Labs    07/31/20 1221  BNP 24.0   Basic Metabolic Panel: Recent Labs  Lab 07/31/20 1221 08/01/20 0424  NA 133* 138  K 3.3* 4.1  CL 96* 99  CO2 27 30  GLUCOSE 142* 99  BUN 9 8  CREATININE 1.16 1.20  CALCIUM 9.1 9.2  MG  --  1.6*   Liver Function Tests: No results for input(s): AST, ALT, ALKPHOS, BILITOT, PROT, ALBUMIN in the last 168 hours. No results for input(s):  LIPASE, AMYLASE in the last 168 hours. No results for input(s): AMMONIA in the last 168 hours. CBC: Recent Labs  Lab 07/31/20 1221 08/01/20 0424  WBC 7.4 9.4  HGB 15.6 15.6  HCT 44.2 44.3  MCV 91.7 92.5  PLT 460* 486*   Cardiac Enzymes: No results for input(s): CKTOTAL, CKMB, CKMBINDEX, TROPONINI in the last 168 hours. BNP: Invalid input(s): POCBNP CBG: No results for input(s): GLUCAP in the last 168 hours. D-Dimer No results for input(s): DDIMER in the last 72 hours. Hgb A1c No results for input(s): HGBA1C in the last 72 hours. Lipid Profile No  results for input(s): CHOL, HDL, LDLCALC, TRIG, CHOLHDL, LDLDIRECT in the last 72 hours. Thyroid function studies No results for input(s): TSH, T4TOTAL, T3FREE, THYROIDAB in the last 72 hours.  Invalid input(s): FREET3 Anemia work up No results for input(s): VITAMINB12, FOLATE, FERRITIN, TIBC, IRON, RETICCTPCT in the last 72 hours. Urinalysis    Component Value Date/Time   COLORURINE YELLOW (A) 07/31/2020 1537   APPEARANCEUR HAZY (A) 07/31/2020 1537   LABSPEC 1.011 07/31/2020 1537   PHURINE 7.0 07/31/2020 1537   GLUCOSEU NEGATIVE 07/31/2020 1537   HGBUR NEGATIVE 07/31/2020 1537   BILIRUBINUR NEGATIVE 07/31/2020 1537   KETONESUR NEGATIVE 07/31/2020 1537   PROTEINUR NEGATIVE 07/31/2020 1537   NITRITE NEGATIVE 07/31/2020 1537   LEUKOCYTESUR MODERATE (A) 07/31/2020 1537   Sepsis Labs Invalid input(s): PROCALCITONIN,  WBC,  LACTICIDVEN Microbiology Recent Results (from the past 240 hour(s))  SARS Coronavirus 2 by RT PCR (hospital order, performed in [facility] hospital lab) Nasopharyngeal Nasopharyngeal Swab     Status: None   Collection Time: 07/31/20  3:37 PM   Specimen: Nasopharyngeal Swab  Result Value Ref Range Status   SARS Coronavirus 2 NEGATIVE NEGATIVE Final    Comment: (NOTE) SARS-CoV-2 target nucleic acids are NOT DETECTED.  The SARS-CoV-2 RNA is generally detectable in upper and lower respiratory specimens during the acute phase of infection. The lowest concentration of SARS-CoV-2 viral copies this assay can detect is 250 copies / mL. A negative result does not preclude SARS-CoV-2 infection and should not be used as the sole basis for treatment or other patient management decisions.  A negative result may occur with improper specimen collection / handling, submission of specimen other than nasopharyngeal swab, presence of viral mutation(s) within the areas targeted by this assay, and inadequate number of viral copies (<250 copies / mL). A negative result must be  combined with clinical observations, patient history, and epidemiological information.  Fact Sheet for Patients:   StrictlyIdeas.no  Fact Sheet for Healthcare Providers: BankingDealers.co.za  This test is not yet approved or  cleared by the [place] FDA and has been authorized for detection and/or diagnosis of SARS-CoV-2 by FDA under an Emergency Use Authorization (EUA).  This EUA will remain in effect (meaning this test can be used) for the duration of the COVID-19 declaration under Section 564(b)(1) of the Act, 21 U.S.C. section 360bbb-3(b)(1), unless the authorization is terminated or revoked sooner.  Performed at [facility], [address] [place], [place] [address]      Time coordinating discharge: 38 minutes  SIGNED:   Georgette Shell, MD  [facility] 08/01/2020, 2:43 PM Pager   If 7PM-7AM, please contact night-coverage [email or web address] Password [email or web address]

## 2020-08-01 NOTE — Progress Notes (Signed)
OT Screen Note  Patient Details Name: Brad Cummings MRN: [id number] DOB: Jan 21, 1951   Cancelled Treatment:    Reason Eval/Treat Not Completed: OT screened, no needs identified, will sign off. Order received, chart reviewed. Pt back to baseline functional independence. No skilled OT needs identified. Will sign off. Please re-consult if additional needs arise.   Jeni Salles, MPH, MS, OTR/L ascom [phone] 08/01/20, 1:45 PM

## 2020-08-01 NOTE — Care Management Obs Status (Signed)
[facility] NOTIFICATION   Patient Details  Name: HUBER MATHERS MRN: [id number] Date of Birth: [birth year]   Medicare Observation Status Notification Given:  Yes    Shelbie Hutching, RN 08/01/2020, 1:17 PM

## 2020-08-05 ENCOUNTER — Ambulatory Visit: Payer: Self-pay | Admitting: Urology

## 2020-08-30 ENCOUNTER — Ambulatory Visit (INDEPENDENT_AMBULATORY_CARE_PROVIDER_SITE_OTHER): Payer: 59 | Admitting: General Surgery

## 2020-08-30 ENCOUNTER — Other Ambulatory Visit: Payer: Self-pay

## 2020-08-30 ENCOUNTER — Telehealth: Payer: Self-pay | Admitting: General Surgery

## 2020-08-30 ENCOUNTER — Encounter: Payer: Self-pay | Admitting: General Surgery

## 2020-08-30 VITALS — BP 141/83 | HR 114 | Temp 97.9°F | Ht 71.0 in | Wt 168.4 lb

## 2020-08-30 DIAGNOSIS — K432 Incisional hernia without obstruction or gangrene: Secondary | ICD-10-CM

## 2020-08-30 NOTE — Patient Instructions (Addendum)
Our surgery scheduler Pamala Hurry will contact you within the next 24-48 hours to discuss surgery. During that call, she will also discuss the preparation prior to surgery. Please have the BLUE sheet available when she contacts you. If you have any questions regarding surgery, please do not hesitate to give our office a call.   Umbilical Hernia, Adult  A hernia is a bulge of tissue that pushes through an opening between muscles. An umbilical hernia happens in the abdomen, near the belly button (umbilicus). The hernia may contain tissues from the small intestine, large intestine, or fatty tissue covering the intestines (omentum). Umbilical hernias in adults tend to get worse over time, and they require surgical treatment. There are several types of umbilical hernias. You may have:  A hernia located just above or below the umbilicus (indirect hernia). This is the most common type of umbilical hernia in adults.  A hernia that forms through an opening formed by the umbilicus (direct hernia).  A hernia that comes and goes (reducible hernia). A reducible hernia may be visible only when you strain, lift something heavy, or cough. This type of hernia can be pushed back into the abdomen (reduced).  A hernia that traps abdominal tissue inside the hernia (incarcerated hernia). This type of hernia cannot be reduced.  A hernia that cuts off blood flow to the tissues inside the hernia (strangulated hernia). The tissues can start to die if this happens. This type of hernia requires emergency treatment. What are the causes? An umbilical hernia happens when tissue inside the abdomen presses on a weak area of the abdominal muscles. What increases the risk? You may have a greater risk of this condition if you:  Are obese.  Have had several pregnancies.  Have a buildup of fluid inside your abdomen (ascites).  Have had surgery that weakens the abdominal muscles. What are the signs or symptoms? The main symptom  of this condition is a painless bulge at or near the belly button. A reducible hernia may be visible only when you strain, lift something heavy, or cough. Other symptoms may include:  Dull pain.  A feeling of pressure. Symptoms of a strangulated hernia may include:  Pain that gets increasingly worse.  Nausea and vomiting.  Pain when pressing on the hernia.  Skin over the hernia becoming red or purple.  Constipation.  Blood in the stool. How is this diagnosed? This condition may be diagnosed based on:  A physical exam. You may be asked to cough or strain while standing. These actions increase the pressure inside your abdomen and force the hernia through the opening in your muscles. Your health care provider may try to reduce the hernia by pressing on it.  Your symptoms and medical history. How is this treated? Surgery is the only treatment for an umbilical hernia. Surgery for a strangulated hernia is done as soon as possible. If you have a small hernia that is not incarcerated, you may need to lose weight before having surgery. Follow these instructions at home:  Lose weight, if told by your health care provider.  Do not try to push the hernia back in.  Watch your hernia for any changes in color or size. Tell your health care provider if any changes occur.  You may need to avoid activities that increase pressure on your hernia.  Do not lift anything that is heavier than 10 lb (4.5 kg) until your health care provider says that this is safe.  Take over-the-counter and prescription medicines  only as told by your health care provider.  Keep all follow-up visits as told by your health care provider. This is important. Contact a health care provider if:  Your hernia gets larger.  Your hernia becomes painful. Get help right away if:  You develop sudden, severe pain near the area of your hernia.  You have pain as well as nausea or vomiting.  You have pain and the skin over  your hernia changes color.  You develop a fever. This information is not intended to replace advice given to you by your health care provider. Make sure you discuss any questions you have with your health care provider. Document Revised: 01/29/2018 Document Reviewed: 06/17/2017 Elsevier Patient Education  [place].

## 2020-08-30 NOTE — H&P (View-Only) (Signed)
Patient ID: Brad Cummings, male   DOB: October 20, 1951, 69 y.o.   MRN: [id number]  Chief Complaint  Patient presents with  . Follow-up    f/u appendectomy on 06/11/20- now has a lump near incision    HPI Brad Cummings is a 69 y.o. male.   He underwent a laparoscopic appendectomy on June 11, 2020 for acute perforated and gangrenous appendicitis.  He was last seen in my office on June 21, 2020, at which time his drain was removed, and he was doing well.  He contacted our office stating that he had a bulge at his periumbilical incision site.  He states that it has been present since 2 or 3 days after he went home (it was not present at the time I saw him in clinic on June 22).  He reports having been compliant with his 10 pound lifting restriction for the full 3 weeks after surgery.  He denies any pain associated with the bulge.  No nausea or vomiting.  He just finds it cosmetically unappealing and would like to have it repaired.  Of note, he does have a history of prior umbilical hernia repair in the past.   Past Medical History:  Diagnosis Date  . Anxiety   . Colon polyp   . HLD (hyperlipidemia)   . HTN (hypertension)   . Hx of colonic polyps   . Hypogonadism in male   . Staph infection   . Ventral hernia     Past Surgical History:  Procedure Laterality Date  . COLONOSCOPY W/ BIOPSIES    . knee scope Left 1989  . LAPAROSCOPIC APPENDECTOMY N/A 06/11/2020   Procedure: APPENDECTOMY LAPAROSCOPIC;  Surgeon: Fredirick Maudlin, MD;  Location: [facility] ORS;  Service: General;  Laterality: N/A;  . LAPAROSCOPY     umbilical hernia  . umbilcal hernia repair N/A     Family History  Problem Relation Age of Onset  . Colon polyps Brother   . Colon polyps Father   . Diabetes Father   . Heart disease Father   . Colon cancer Brother 20       removed a foot of his colon  . Heart disease Brother     Social History Social History   Tobacco Use  . Smoking status: Never Smoker  . Smokeless tobacco:  Never Used  Substance Use Topics  . Alcohol use: Yes    Alcohol/week: 8.0 standard drinks    Types: 4 Cans of beer, 4 Glasses of wine per week  . Drug use: Yes    Types: Marijuana    Allergies  Allergen Reactions  . Penicillins     "Really can't remember as it was from childhood."    Current Outpatient Medications  Medication Sig Dispense Refill  . acetaminophen (TYLENOL) 500 MG tablet Take 500 mg by mouth every 6 (six) hours as needed.      . ALPRAZolam (XANAX) 1 MG tablet Take 1 mg by mouth 2 (two) times daily as needed.    Marland Kitchen atorvastatin (LIPITOR) 40 MG tablet Take 1 tablet (40 mg total) by mouth daily. 30 tablet 11  . citalopram (CELEXA) 20 MG tablet Take 20 mg by mouth daily.    . fluticasone (FLONASE) 50 MCG/ACT nasal spray USE 2 SPRAYS IN EACH NOSTRIL EVERY DAY 16 g 5  . folic acid (FOLVITE) 1 MG tablet Take 1 tablet (1 mg total) by mouth daily.    Marland Kitchen lisinopril-hydrochlorothiazide (PRINZIDE,ZESTORETIC) 20-12.5 MG per tablet TAKE ONE TABLET EVERY MORNING  30 tablet 5  . Magnesium Oxide 400 MG CAPS Take 1 capsule (400 mg total) by mouth in the morning and at bedtime.  0  . Multiple Vitamin (MULTIVITAMIN WITH MINERALS) TABS tablet Take 1 tablet by mouth daily.    Marland Kitchen testosterone cypionate (DEPOTESTOSTERONE CYPIONATE) 200 MG/ML injection Inject 0.5 mLs (100 mg total) into the muscle once a week. 2 mL 0  . thiamine 100 MG tablet Take 1 tablet (100 mg total) by mouth daily.     No current facility-administered medications for this visit.    Review of Systems Review of Systems  All other systems reviewed and are negative.   Blood pressure (!) 141/83, pulse (!) 114, temperature 97.9 F (36.6 C), temperature source Oral, height 5\' 11"  (1.803 m), weight 168 lb 6.4 oz (76.4 kg), SpO2 97 %.  Physical Exam Physical Exam Constitutional:      General: He is not in acute distress.    Appearance: Normal appearance. He is normal weight.  HENT:     Head: Normocephalic and atraumatic.      Nose:     Comments: Covered with a mask    Mouth/Throat:     Comments: Covered with a mask Eyes:     General: No scleral icterus.       Right eye: No discharge.        Left eye: No discharge.  Cardiovascular:     Rate and Rhythm: Regular rhythm. Tachycardia present.  Pulmonary:     Effort: Pulmonary effort is normal. No respiratory distress.  Abdominal:     Palpations: Abdomen is soft.     Tenderness: There is no abdominal tenderness.     Hernia: A hernia is present.     Comments: There is a small incisional hernia at the most cranial aspect of the vertical incision just above the umbilicus, used for his laparoscopic appendectomy.  Genitourinary:    Comments: Deferred Musculoskeletal:        General: No swelling or deformity.  Skin:    General: Skin is warm and dry.  Neurological:     General: No focal deficit present.     Mental Status: He is alert and oriented to person, place, and time.  Psychiatric:        Mood and Affect: Mood normal.        Behavior: Behavior normal.     Data Reviewed No recent relevant data available for review  Assessment This is a 69 year old man who underwent a laparoscopic appendectomy in June.  He appears to have developed a hernia in the incision used for port access just above his umbilicus.  He is currently not experiencing any pain or any symptoms of obstruction, but he is interested in having it repaired.  Plan We will schedule him for an open incisional hernia repair.  I have discussed the risks of the operation with him.  These include, but are not limited to, bleeding, infection, damage to surrounding structures such as bowel, mesh complications, or hernia recurrence.  He has agreed to accept these risks and would like to proceed.  We will work on getting him scheduled.    Fredirick Maudlin 08/30/2020, 10:08 AM

## 2020-08-30 NOTE — Progress Notes (Signed)
Patient ID: Brad Cummings, male   DOB: March 03, 1951, 69 y.o.   MRN: [id number]  Chief Complaint  Patient presents with  . Follow-up    f/u appendectomy on 06/11/20- now has a lump near incision    HPI Brad Cummings is a 69 y.o. male.   He underwent a laparoscopic appendectomy on June 11, 2020 for acute perforated and gangrenous appendicitis.  He was last seen in my office on June 21, 2020, at which time his drain was removed, and he was doing well.  He contacted our office stating that he had a bulge at his periumbilical incision site.  He states that it has been present since 2 or 3 days after he went home (it was not present at the time I saw him in clinic on June 22).  He reports having been compliant with his 10 pound lifting restriction for the full 3 weeks after surgery.  He denies any pain associated with the bulge.  No nausea or vomiting.  He just finds it cosmetically unappealing and would like to have it repaired.  Of note, he does have a history of prior umbilical hernia repair in the past.   Past Medical History:  Diagnosis Date  . Anxiety   . Colon polyp   . HLD (hyperlipidemia)   . HTN (hypertension)   . Hx of colonic polyps   . Hypogonadism in male   . Staph infection   . Ventral hernia     Past Surgical History:  Procedure Laterality Date  . COLONOSCOPY W/ BIOPSIES    . knee scope Left 1989  . LAPAROSCOPIC APPENDECTOMY N/A 06/11/2020   Procedure: APPENDECTOMY LAPAROSCOPIC;  Surgeon: Fredirick Maudlin, MD;  Location: [facility] ORS;  Service: General;  Laterality: N/A;  . LAPAROSCOPY     umbilical hernia  . umbilcal hernia repair N/A     Family History  Problem Relation Age of Onset  . Colon polyps Brother   . Colon polyps Father   . Diabetes Father   . Heart disease Father   . Colon cancer Brother 53       removed a foot of his colon  . Heart disease Brother     Social History Social History   Tobacco Use  . Smoking status: Never Smoker  . Smokeless tobacco:  Never Used  Substance Use Topics  . Alcohol use: Yes    Alcohol/week: 8.0 standard drinks    Types: 4 Cans of beer, 4 Glasses of wine per week  . Drug use: Yes    Types: Marijuana    Allergies  Allergen Reactions  . Penicillins     "Really can't remember as it was from childhood."    Current Outpatient Medications  Medication Sig Dispense Refill  . acetaminophen (TYLENOL) 500 MG tablet Take 500 mg by mouth every 6 (six) hours as needed.      . ALPRAZolam (XANAX) 1 MG tablet Take 1 mg by mouth 2 (two) times daily as needed.    Marland Kitchen atorvastatin (LIPITOR) 40 MG tablet Take 1 tablet (40 mg total) by mouth daily. 30 tablet 11  . citalopram (CELEXA) 20 MG tablet Take 20 mg by mouth daily.    . fluticasone (FLONASE) 50 MCG/ACT nasal spray USE 2 SPRAYS IN EACH NOSTRIL EVERY DAY 16 g 5  . folic acid (FOLVITE) 1 MG tablet Take 1 tablet (1 mg total) by mouth daily.    Marland Kitchen lisinopril-hydrochlorothiazide (PRINZIDE,ZESTORETIC) 20-12.5 MG per tablet TAKE ONE TABLET EVERY MORNING  30 tablet 5  . Magnesium Oxide 400 MG CAPS Take 1 capsule (400 mg total) by mouth in the morning and at bedtime.  0  . Multiple Vitamin (MULTIVITAMIN WITH MINERALS) TABS tablet Take 1 tablet by mouth daily.    Marland Kitchen testosterone cypionate (DEPOTESTOSTERONE CYPIONATE) 200 MG/ML injection Inject 0.5 mLs (100 mg total) into the muscle once a week. 2 mL 0  . thiamine 100 MG tablet Take 1 tablet (100 mg total) by mouth daily.     No current facility-administered medications for this visit.    Review of Systems Review of Systems  All other systems reviewed and are negative.   Blood pressure (!) 141/83, pulse (!) 114, temperature 97.9 F (36.6 C), temperature source Oral, height 5\' 11"  (1.803 m), weight 168 lb 6.4 oz (76.4 kg), SpO2 97 %.  Physical Exam Physical Exam Constitutional:      General: He is not in acute distress.    Appearance: Normal appearance. He is normal weight.  HENT:     Head: Normocephalic and atraumatic.      Nose:     Comments: Covered with a mask    Mouth/Throat:     Comments: Covered with a mask Eyes:     General: No scleral icterus.       Right eye: No discharge.        Left eye: No discharge.  Cardiovascular:     Rate and Rhythm: Regular rhythm. Tachycardia present.  Pulmonary:     Effort: Pulmonary effort is normal. No respiratory distress.  Abdominal:     Palpations: Abdomen is soft.     Tenderness: There is no abdominal tenderness.     Hernia: A hernia is present.     Comments: There is a small incisional hernia at the most cranial aspect of the vertical incision just above the umbilicus, used for his laparoscopic appendectomy.  Genitourinary:    Comments: Deferred Musculoskeletal:        General: No swelling or deformity.  Skin:    General: Skin is warm and dry.  Neurological:     General: No focal deficit present.     Mental Status: He is alert and oriented to person, place, and time.  Psychiatric:        Mood and Affect: Mood normal.        Behavior: Behavior normal.     Data Reviewed No recent relevant data available for review  Assessment This is a 69 year old man who underwent a laparoscopic appendectomy in June.  He appears to have developed a hernia in the incision used for port access just above his umbilicus.  He is currently not experiencing any pain or any symptoms of obstruction, but he is interested in having it repaired.  Plan We will schedule him for an open incisional hernia repair.  I have discussed the risks of the operation with him.  These include, but are not limited to, bleeding, infection, damage to surrounding structures such as bowel, mesh complications, or hernia recurrence.  He has agreed to accept these risks and would like to proceed.  We will work on getting him scheduled.    Fredirick Maudlin 08/30/2020, 10:08 AM

## 2020-08-30 NOTE — Telephone Encounter (Signed)
Pt has been advised of Pre-Admission date/time, COVID Testing date and Surgery date.  Surgery Date: 09/09/20 Preadmission Testing Date: 09/01/20 (phone 8a-1p) Covid Testing Date: 09/07/20 - patient advised to go to the [facility] ([place]) between 8a-1p   Patient has been made aware to call [phone], between 1-3:00pm the day before surgery, to find out what time to arrive for surgery.

## 2020-09-01 ENCOUNTER — Encounter
Admission: RE | Admit: 2020-09-01 | Discharge: 2020-09-01 | Disposition: A | Discharge status: Home / Self Care | Payer: 59 | Source: Ambulatory Visit | Attending: General Surgery | Admitting: General Surgery

## 2020-09-01 ENCOUNTER — Other Ambulatory Visit: Payer: Self-pay

## 2020-09-01 HISTORY — DX: Syncope and collapse: R55

## 2020-09-01 NOTE — Pre-Procedure Instructions (Signed)
EKG: Independently reviewed. 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 The differential diagnosis is broad, including vasovagal syncope, TIA, alcohol intoxication, drug abuse, orthostatic status.  - Place on tele bed for obs - Orthostatic vital signs  - MRI-brain - 2d echo - Neuro checks  - IVF: NS 100 cc/h - PT/OT eval and treat  HLD (hyperlipidemia) -Lipitor  Anxiety and depression -Continue home Xanax, Celexa  Alcohol abuse -CIWA protocol  H/O: HTN (hypertension) -IV hydralazine as needed -Continue present  Hypokalemia -Repleted -Check magnesium level         DVT ppx: SQ Lovenox Code Status: Full code Family Communication: not done, no family member is at bed side.    Disposition Plan:  Anticipate discharge back to previous environment Consults called:  none Admission status: Med-surg bed for obs  Status is: Observation  The patient remains OBS appropriate and will d/c before 2 midnights.  Dispo: The patient is from: Home  Anticipated d/c is to: Home  Anticipated d/c date is: 1 day  Patient currently is not medically stable to d/c.         Date of Service 07/31/2020    Ivor Costa [facility]   If 7PM-7AM, please contact night-coverage [email or web address] 07/31/2020, 6:44 PM         Electronically signed by Ivor Costa, MD at 07/31/2020 6:44 PM  ED to Hosp-Admission (Discharged) on 07/31/2020   ED to Hosp-Admission (Discharged) on 07/31/2020     Routing History     Detailed Report    Note shared with patient

## 2020-09-01 NOTE — Pre-Procedure Instructions (Signed)
EKG EKG read interpreted by me shows normal sinus rhythm rate of 78 normal axis patient has right bundle branch block but no acute changes.  Right bundle branch block was present in 2012.  ____________________________________________  [place]  ED MD interpretation: Head CT and neck x-ray showed no acute disease per radiology I reviewed the film.  Chest x-ray showed a apparent nodule in the left lung base I reviewed that film as well.  Radiology suggested chest CT to evaluate the lung nodule.  Chest CT was done and shows no lung nodule.  This nodule looks like it was a confluence of shadows or nipple shadow.  Official radiology report(s): CT Head Wo Contrast  Result Date: 07/31/2020 CLINICAL DATA:  69 year old with acute onset of dizziness that began suddenly this morning, who had a syncopal episode thereafter and was found on the floor upon EMS arrival. Initial encounter. EXAM: CT HEAD WITHOUT CONTRAST CT CERVICAL SPINE WITHOUT CONTRAST TECHNIQUE: Multidetector CT imaging of the head and cervical spine was performed following the standard protocol without intravenous contrast. Multiplanar CT image reconstructions of the cervical spine were also generated. COMPARISON:  CT head 01/18/2011.  No prior cervical spine imaging. FINDINGS: CT HEAD FINDINGS Brain: Mild-to-moderate age related cortical and deep atrophy. 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 Remote stroke involving the medial LEFT cerebellum. Remote stroke involving the LEFT superior cerebellum with encephalomalacia. Mild changes of small vessel disease of the white matter. No mass lesion or midline shift. No acute hemorrhage or hematoma. No extra-axial fluid collections. Vascular: Mild BILATERAL carotid siphon and BILATERAL vertebral artery atherosclerosis. No hyperdense vessel. Skull: No skull fracture or other focal osseous abnormality involving the skull. Sinuses/Orbits: Visualized  paranasal sinuses, bilateral mastoid air cells and bilateral middle ear cavities well-aerated. Visualized orbits and globes normal in appearance. Other: None. CT CERVICAL SPINE FINDINGS Alignment: Anatomic posterior alignment. Facet joints anatomically aligned with degenerative changes throughout. Skull base and vertebrae: No fractures identified involving the cervical spine. Coronal reformatted images demonstrate an intact craniocervical junction, intact dens and intact lateral masses throughout. Soft tissues and spinal canal: Borderline multifactorial spinal stenosis at C6-7. No evidence of canal hematoma. Disc levels: Large bridging osteophytes anteriorly at C3-4, C4-5, C5-6 and C6-7. Severe disc space narrowing at C5-6, C6-7 and moderate disc space narrowing at C7-T1. Facet and uncinate hypertrophy account for multilevel foraminal stenoses including moderate to severe LEFT C2-3, severe LEFT and moderate RIGHT C3-4, severe BILATERAL C4-5, severe RIGHT and moderate LEFT C5-6, severe BILATERAL C6-7, and mild BILATERAL C7-T1. Upper chest: Visualized lung apices clear. Visualized superior mediastinum normal. Other: Mild BILATERAL cervical carotid atherosclerosis. IMPRESSION: BRAIN: 1. No acute intracranial abnormality. 2. 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 Remote strokes involving the LEFT superior cerebellum and the medial LEFT cerebellum. 3. Mild-to-moderate age related generalized atrophy and mild chronic microvascular ischemic changes of the white matter. C-SPINE: 1. No cervical spine fractures identified. 2. Multilevel degenerative disc disease, spondylosis and facet degenerative changes account for multilevel foraminal stenoses detailed above. Borderline multifactorial spinal stenosis at C6- Electronically Signed   By: Evangeline Dakin M.D.   On: 07/31/2020 14:01   CT Chest Wo Contrast  Result Date: 07/31/2020 CLINICAL DATA:  Abnormal chest x-ray.   Pulmonary nodule. EXAM: CT CHEST WITHOUT CONTRAST TECHNIQUE: Multidetector CT imaging of the chest was performed following the standard protocol without IV contrast. COMPARISON:  Chest x-ray from earlier same day. FINDINGS: Cardiovascular: Heart size is normal. No  pericardial effusion. Three-vessel coronary artery calcifications. Aortic atherosclerosis. No thoracic aortic aneurysm. Mediastinum/Nodes: No mass or enlarged lymph nodes are seen within the mediastinum or perihilar regions. Esophagus appears normal. Trachea and central bronchi are unremarkable. Lungs/Pleura: Lungs are clear. No pulmonary nodule or mass. No pneumonia or pulmonary edema. No pleural effusion. Upper Abdomen: Limited images of the upper abdomen are unremarkable. Musculoskeletal: Degenerative spondylosis and ankylosis of the slightly kyphotic thoracolumbar spine, mild to moderate in degree. No acute or suspicious osseous finding. IMPRESSION: 1. No acute findings. 2. Lungs are clear. No pulmonary nodule or mass. The questioned pulmonary nodule within the LEFT lower lobe on today's earlier chest x-ray is presumably superimposition of normal soft tissues or nipple shadow. 3. Coronary artery calcifications. Aortic Atherosclerosis (ICD10-I70.0). Electronically Signed   By: Franki Cabot M.D.   On: 07/31/2020 14:45   CT Cervical Spine Wo Contrast  Result Date: 07/31/2020 CLINICAL DATA:  69 year old with acute onset of dizziness that began suddenly this morning, who had a syncopal episode thereafter and was found on the floor upon EMS arrival. Initial encounter. EXAM: CT HEAD WITHOUT CONTRAST CT CERVICAL SPINE WITHOUT CONTRAST TECHNIQUE: Multidetector CT imaging of the head and cervical spine was performed following the standard protocol without intravenous contrast. Multiplanar CT image reconstructions of the cervical spine were also generated. COMPARISON:  CT head 01/18/2011.  No prior cervical spine imaging. FINDINGS: CT HEAD FINDINGS Brain:  Mild-to-moderate age related cortical and deep atrophy. 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 Remote stroke involving the medial LEFT cerebellum. Remote stroke involving the LEFT superior cerebellum with encephalomalacia. Mild changes of small vessel disease of the white matter. No mass lesion or midline shift. No acute hemorrhage or hematoma. No extra-axial fluid collections. Vascular: Mild BILATERAL carotid siphon and BILATERAL vertebral artery atherosclerosis. No hyperdense vessel. Skull: No skull fracture or other focal osseous abnormality involving the skull. Sinuses/Orbits: Visualized paranasal sinuses, bilateral mastoid air cells and bilateral middle ear cavities well-aerated. Visualized orbits and globes normal in appearance. Other: None. CT CERVICAL SPINE FINDINGS Alignment: Anatomic posterior alignment. Facet joints anatomically aligned with degenerative changes throughout. Skull base and vertebrae: No fractures identified involving the cervical spine. Coronal reformatted images demonstrate an intact craniocervical junction, intact dens and intact lateral masses throughout. Soft tissues and spinal canal: Borderline multifactorial spinal stenosis at C6-7. No evidence of canal hematoma. Disc levels: Large bridging osteophytes anteriorly at C3-4, C4-5, C5-6 and C6-7. Severe disc space narrowing at C5-6, C6-7 and moderate disc space narrowing at C7-T1. Facet and uncinate hypertrophy account for multilevel foraminal stenoses including moderate to severe LEFT C2-3, severe LEFT and moderate RIGHT C3-4, severe BILATERAL C4-5, severe RIGHT and moderate LEFT C5-6, severe BILATERAL C6-7, and mild BILATERAL C7-T1. Upper chest: Visualized lung apices clear. Visualized superior mediastinum normal. Other: Mild BILATERAL cervical carotid atherosclerosis. IMPRESSION: BRAIN: 1. No acute intracranial abnormality. 2. Remote cortical stroke involving the RIGHT posterior  parietal lobe with encephalomalacia. Remote strokes involving the LEFT superior cerebellum and the medial LEFT cerebellum. 3. Mild-to-moderate age related generalized atrophy and mild chronic microvascular ischemic changes of the white matter. C-SPINE: 1. No cervical spine fractures identified. 2. Multilevel degenerative disc disease, spondylosis and facet degenerative changes account for multilevel foraminal stenoses detailed above. Borderline multifactorial spinal stenosis at C6- Electronically Signed   By: Evangeline Dakin M.D.   On: 07/31/2020 14:01   DG Chest Portable 1 View  Result Date: 07/31/2020 CLINICAL DATA:  Syncope EXAM: PORTABLE CHEST 1 VIEW COMPARISON:  01/17/2011 FINDINGS: 1254 hours. The lungs are clear without focal pneumonia, edema, pneumothorax or pleural effusion. 11 mm nodular opacity identified at the peripheral left lung base. The cardiopericardial silhouette is within normal limits for size. The visualized bony structures of the thorax show now acute abnormality. Telemetry leads overlie the chest. IMPRESSION: 1. 11 mm nodule at the peripheral left lung base. CT chest without contrast recommended to further evaluate. 2. No other acute cardiopulmonary findings. Electronically Signed   By: Misty Stanley M.D.   On: 07/31/2020 13:46    ____________________________________________   PROCEDURES  Procedure(s) performed (including Critical Care): Critical care time 20 minutes.  This includes evaluating the patient's old records and EKG talking to the patient and then later talking to his ex-wife who is not present in the room at the same time.  She confirmed the patient's history of walking and then passing out with no symptoms.  Additionally I took time to speak to the hospitalist about the patient.  Procedures   ____________________________________________   INITIAL IMPRESSION / ASSESSMENT AND PLAN / ED COURSE  Patient with syncope.  Troponin is still pending at  this time.  Hospitalist will come and see the patient.          ____________________________________________   FINAL CLINICAL IMPRESSION(S) / ED DIAGNOSES  Final diagnoses:  Syncope and collapse        ED Discharge Orders    None       Note:  This document was prepared using Dragon voice recognition software and may include unintentional dictation errors.    Nena Polio, MD 07/31/20 1543         Electronically signed by Nena Polio, MD at 07/31/2020 3:43 PM  ED to Hosp-Admission (Discharged) on 07/31/2020   ED to Hosp-Admission (Discharged) on 07/31/2020     Detailed Report    Note shared with patient

## 2020-09-01 NOTE — Patient Instructions (Addendum)
Your procedure is scheduled on: 09-09-20 FRIDAY Report to Same Day Surgery 2nd floor medical mall [facility] Entrance-take elevator on left to 2nd floor.  Check in with surgery information desk.) To find out your arrival time please call [phone] between 1PM - 3PM on 09-08-20 THURSDAY  Remember: Instructions that are not followed completely may result in serious medical risk, up to and including death, or upon the discretion of your surgeon and anesthesiologist your surgery may need to be rescheduled.    _x___ 1. Do not eat food after midnight the night before your procedure. NO GUM OR CANDY AFTER MIDNIGHT. You may drink clear liquids up to 2 hours before you are scheduled to arrive at the hospital for your procedure.  Do not drink clear liquids within 2 hours of your scheduled arrival to the hospital.  Clear liquids include  --Water or Apple juice without pulp  --Gatorade  --Black Coffee or Clear Tea (No milk, no creamers, do not add anything to the coffee or Tea-OK TO ADD SUGAR)     __x__ 2. No Alcohol for 24 hours before or after surgery.   __x__3. No Smoking or e-cigarettes for 24 prior to surgery.  Do not use any chewable tobacco products for at least 6 hour prior to surgery   ____  4. Bring all medications with you on the day of surgery if instructed.    __x__ 5. Notify your doctor if there is any change in your medical condition     (cold, fever, infections).    x___6. On the morning of surgery brush your teeth with toothpaste and water.  You may rinse your mouth with mouth wash if you wish.  Do not swallow any toothpaste or mouthwash.   Do not wear jewelry, make-up, hairpins, clips or nail polish.  Do not wear lotions, powders, or perfumes.   Do not shave 48 hours prior to surgery. Men may shave face and neck.  Do not bring valuables to the hospital.    [facility] is not responsible for any belongings or valuables.               Contacts, dentures or bridgework may not  be worn into surgery.  Leave your suitcase in the car. After surgery it may be brought to your room.  For patients admitted to the hospital, discharge time is determined by your treatment team.  _  Patients discharged the day of surgery will not be allowed to drive home.  You will need someone to drive you home and stay with you the night of your procedure.    Please read over the following fact sheets that you were given:   [facility], [facility], [facility] Preparing for Surgery and or MRSA Information   _x___ TAKE THE FOLLOWING MEDICATION THE MORNING OF SURGERY WITH A SMALL SIP OF WATER. These include:  1. XANAX (ALPRAZOLAM)  2. CELEXA (CITALOPRAM)  3. BUSPAR (BUSPIRONE)  4. NEXLIZET (BEMPEDOIC ACID-EZETIMIBE)  5. LIPITOR (ATORVASTATIN)  6.  ____Fleets enema or Magnesium Citrate as directed.   _x___ Use CHG Soap as directed on instruction sheet   ____ Use inhalers on the day of surgery and bring to hospital day of surgery  ____ Stop Metformin and Janumet 2 days prior to surgery.    ____ Take 1/2 of usual insulin dose the night before surgery and none on the morning surgery.   ____ Follow recommendations from Cardiologist, Pulmonologist or PCP regarding stopping Aspirin, Coumadin, Plavix ,Eliquis, Effient, or Pradaxa, and Pletal.  X____Stop Anti-inflammatories such as Advil, Aleve, Ibuprofen, Motrin, Naproxen, Naprosyn, Goodies powders or aspirin products NOW-OK to take Tylenol    ____ Stop supplements until after surgery.     ____ Bring C-Pap to the hospital.

## 2020-09-06 ENCOUNTER — Other Ambulatory Visit: Payer: Self-pay | Admitting: General Surgery

## 2020-09-06 DIAGNOSIS — K432 Incisional hernia without obstruction or gangrene: Secondary | ICD-10-CM

## 2020-09-07 ENCOUNTER — Other Ambulatory Visit
Admission: RE | Admit: 2020-09-07 | Discharge: 2020-09-07 | Disposition: A | Discharge status: Home / Self Care | Payer: 59 | Source: Ambulatory Visit | Attending: General Surgery | Admitting: General Surgery

## 2020-09-07 ENCOUNTER — Encounter: Payer: Self-pay | Admitting: Urology

## 2020-09-07 ENCOUNTER — Ambulatory Visit: Payer: 59 | Admitting: Urology

## 2020-09-07 DIAGNOSIS — Z20822 Contact with and (suspected) exposure to covid-19: Secondary | ICD-10-CM | POA: Insufficient documentation

## 2020-09-07 DIAGNOSIS — Z01818 Encounter for other preprocedural examination: Secondary | ICD-10-CM | POA: Insufficient documentation

## 2020-09-07 LAB — SARS CORONAVIRUS 2 (TAT 6-24 HRS): SARS Coronavirus 2: NEGATIVE

## 2020-09-07 LAB — POTASSIUM: Potassium: 3.8 mmol/L (ref 3.5–5.1)

## 2020-09-08 MED ORDER — CEFAZOLIN SODIUM-DEXTROSE 2-4 GM/100ML-% IV SOLN
2.0000 g | INTRAVENOUS | Status: AC
  Administered 2020-09-09: 2 g via INTRAVENOUS

## 2020-09-09 ENCOUNTER — Encounter
Admission: RE | Disposition: A | Discharge status: Home / Self Care | Payer: Self-pay | Source: Home / Self Care | Attending: General Surgery

## 2020-09-09 ENCOUNTER — Ambulatory Visit: Payer: 59 | Admitting: Urgent Care

## 2020-09-09 ENCOUNTER — Other Ambulatory Visit: Payer: Self-pay

## 2020-09-09 ENCOUNTER — Encounter: Payer: Self-pay | Admitting: General Surgery

## 2020-09-09 ENCOUNTER — Ambulatory Visit
Admission: RE | Admit: 2020-09-09 | Discharge: 2020-09-09 | Disposition: A | Discharge status: Home / Self Care | Payer: 59 | Attending: General Surgery | Admitting: General Surgery

## 2020-09-09 DIAGNOSIS — F419 Anxiety disorder, unspecified: Secondary | ICD-10-CM | POA: Insufficient documentation

## 2020-09-09 DIAGNOSIS — E785 Hyperlipidemia, unspecified: Secondary | ICD-10-CM | POA: Diagnosis not present

## 2020-09-09 DIAGNOSIS — E291 Testicular hypofunction: Secondary | ICD-10-CM | POA: Diagnosis not present

## 2020-09-09 DIAGNOSIS — Z79899 Other long term (current) drug therapy: Secondary | ICD-10-CM | POA: Insufficient documentation

## 2020-09-09 DIAGNOSIS — Z9089 Acquired absence of other organs: Secondary | ICD-10-CM | POA: Insufficient documentation

## 2020-09-09 DIAGNOSIS — I10 Essential (primary) hypertension: Secondary | ICD-10-CM | POA: Diagnosis not present

## 2020-09-09 DIAGNOSIS — Z79890 Hormone replacement therapy (postmenopausal): Secondary | ICD-10-CM | POA: Diagnosis not present

## 2020-09-09 DIAGNOSIS — K432 Incisional hernia without obstruction or gangrene: Secondary | ICD-10-CM

## 2020-09-09 HISTORY — PX: INCISIONAL HERNIA REPAIR: SHX193

## 2020-09-09 LAB — URINE DRUG SCREEN, QUALITATIVE (ARMC ONLY)
Amphetamines, Ur Screen: NOT DETECTED
Barbiturates, Ur Screen: NOT DETECTED
Benzodiazepine, Ur Scrn: POSITIVE — AB
Cannabinoid 50 Ng, Ur ~~LOC~~: NOT DETECTED
Cocaine Metabolite,Ur ~~LOC~~: NOT DETECTED
MDMA (Ecstasy)Ur Screen: NOT DETECTED
Methadone Scn, Ur: NOT DETECTED
Opiate, Ur Screen: NOT DETECTED
Phencyclidine (PCP) Ur S: NOT DETECTED
Tricyclic, Ur Screen: NOT DETECTED

## 2020-09-09 SURGERY — REPAIR, HERNIA, INCISIONAL
Anesthesia: General

## 2020-09-09 MED ORDER — OXYCODONE HCL 5 MG PO TABS: 5.0000 mg | ORAL_TABLET | Freq: Four times a day (QID) | ORAL | 0 refills | Status: AC | PRN

## 2020-09-09 MED ORDER — OXYCODONE HCL 5 MG PO TABS
ORAL_TABLET | ORAL | Status: AC
  Filled 2020-09-09: qty 1

## 2020-09-09 MED ORDER — OXYCODONE HCL 5 MG PO TABS
5.0000 mg | ORAL_TABLET | Freq: Once | ORAL | Status: AC
  Administered 2020-09-09: 5 mg via ORAL

## 2020-09-09 MED ORDER — DEXMEDETOMIDINE HCL IN NACL 200 MCG/50ML IV SOLN
INTRAVENOUS | Status: DC | PRN
  Administered 2020-09-09 (×2): 4 ug via INTRAVENOUS

## 2020-09-09 MED ORDER — EPHEDRINE 5 MG/ML INJ
INTRAVENOUS | Status: AC
  Filled 2020-09-09: qty 10

## 2020-09-09 MED ORDER — LIDOCAINE-EPINEPHRINE 1 %-1:100000 IJ SOLN
INTRAMUSCULAR | Status: AC
  Filled 2020-09-09: qty 1

## 2020-09-09 MED ORDER — ONDANSETRON HCL 4 MG/2ML IJ SOLN: 4.0000 mg | Freq: Once | INTRAMUSCULAR | Status: DC | PRN

## 2020-09-09 MED ORDER — DEXAMETHASONE SODIUM PHOSPHATE 10 MG/ML IJ SOLN
INTRAMUSCULAR | Status: AC
  Filled 2020-09-09: qty 1

## 2020-09-09 MED ORDER — ONDANSETRON HCL 4 MG/2ML IJ SOLN
INTRAMUSCULAR | Status: DC | PRN
  Administered 2020-09-09: 4 mg via INTRAVENOUS

## 2020-09-09 MED ORDER — LIDOCAINE HCL (PF) 2 % IJ SOLN
INTRAMUSCULAR | Status: AC
  Filled 2020-09-09: qty 5

## 2020-09-09 MED ORDER — LACTATED RINGERS IV SOLN: INTRAVENOUS | Status: DC | PRN

## 2020-09-09 MED ORDER — GLYCOPYRROLATE 0.2 MG/ML IJ SOLN
INTRAMUSCULAR | Status: AC
  Filled 2020-09-09: qty 1

## 2020-09-09 MED ORDER — BUPIVACAINE HCL (PF) 0.25 % IJ SOLN
INTRAMUSCULAR | Status: AC
  Filled 2020-09-09: qty 30

## 2020-09-09 MED ORDER — GLYCOPYRROLATE 0.2 MG/ML IJ SOLN
INTRAMUSCULAR | Status: DC | PRN
  Administered 2020-09-09: .2 mg via INTRAVENOUS

## 2020-09-09 MED ORDER — ROCURONIUM BROMIDE 100 MG/10ML IV SOLN
INTRAVENOUS | Status: DC | PRN
  Administered 2020-09-09: 45 mg via INTRAVENOUS
  Administered 2020-09-09: 15 mg via INTRAVENOUS

## 2020-09-09 MED ORDER — ACETAMINOPHEN 500 MG PO TABS: 1000.0000 mg | ORAL_TABLET | ORAL | Status: AC

## 2020-09-09 MED ORDER — LIDOCAINE HCL (CARDIAC) PF 100 MG/5ML IV SOSY
PREFILLED_SYRINGE | INTRAVENOUS | Status: DC | PRN
  Administered 2020-09-09: 40 mg via INTRAVENOUS

## 2020-09-09 MED ORDER — ONDANSETRON HCL 4 MG/2ML IJ SOLN
INTRAMUSCULAR | Status: AC
  Filled 2020-09-09: qty 2

## 2020-09-09 MED ORDER — ORAL CARE MOUTH RINSE: 15.0000 mL | Freq: Once | OROMUCOSAL | Status: AC

## 2020-09-09 MED ORDER — LACTATED RINGERS IV SOLN: INTRAVENOUS | Status: DC

## 2020-09-09 MED ORDER — ROCURONIUM BROMIDE 10 MG/ML (PF) SYRINGE
PREFILLED_SYRINGE | INTRAVENOUS | Status: AC
  Filled 2020-09-09: qty 10

## 2020-09-09 MED ORDER — DEXAMETHASONE SODIUM PHOSPHATE 10 MG/ML IJ SOLN
INTRAMUSCULAR | Status: DC | PRN
  Administered 2020-09-09: 5 mg via INTRAVENOUS

## 2020-09-09 MED ORDER — BUPIVACAINE LIPOSOME 1.3 % IJ SUSP: 20.0000 mL | Freq: Once | INTRAMUSCULAR | Status: DC

## 2020-09-09 MED ORDER — EPHEDRINE SULFATE 50 MG/ML IJ SOLN
INTRAMUSCULAR | Status: DC | PRN
  Administered 2020-09-09: 10 mg via INTRAVENOUS

## 2020-09-09 MED ORDER — FENTANYL CITRATE (PF) 250 MCG/5ML IJ SOLN
INTRAMUSCULAR | Status: AC
  Filled 2020-09-09: qty 5

## 2020-09-09 MED ORDER — PROPOFOL 10 MG/ML IV BOLUS
INTRAVENOUS | Status: DC | PRN
  Administered 2020-09-09: 100 mg via INTRAVENOUS

## 2020-09-09 MED ORDER — BUPIVACAINE LIPOSOME 1.3 % IJ SUSP
INTRAMUSCULAR | Status: AC
  Filled 2020-09-09: qty 20

## 2020-09-09 MED ORDER — CHLORHEXIDINE GLUCONATE 0.12 % MT SOLN
OROMUCOSAL | Status: AC
  Administered 2020-09-09: 15 mL via OROMUCOSAL
  Filled 2020-09-09: qty 15

## 2020-09-09 MED ORDER — ACETAMINOPHEN 500 MG PO TABS
ORAL_TABLET | ORAL | Status: AC
  Administered 2020-09-09: 1000 mg via ORAL
  Filled 2020-09-09: qty 2

## 2020-09-09 MED ORDER — DEXMEDETOMIDINE HCL IN NACL 80 MCG/20ML IV SOLN
INTRAVENOUS | Status: AC
  Filled 2020-09-09: qty 20

## 2020-09-09 MED ORDER — LIDOCAINE-EPINEPHRINE 1 %-1:100000 IJ SOLN
INTRAMUSCULAR | Status: DC | PRN
  Administered 2020-09-09: 10 mL via INTRAMUSCULAR

## 2020-09-09 MED ORDER — BUPIVACAINE LIPOSOME 1.3 % IJ SUSP
INTRAMUSCULAR | Status: DC | PRN
  Administered 2020-09-09: 20 mL

## 2020-09-09 MED ORDER — CHLORHEXIDINE GLUCONATE 0.12 % MT SOLN: 15.0000 mL | Freq: Once | OROMUCOSAL | Status: AC

## 2020-09-09 MED ORDER — FENTANYL CITRATE (PF) 100 MCG/2ML IJ SOLN
INTRAMUSCULAR | Status: DC | PRN
Start: 2020-09-09 — End: 2020-09-09
  Administered 2020-09-09 (×3): 50 ug via INTRAVENOUS

## 2020-09-09 MED ORDER — SUGAMMADEX SODIUM 200 MG/2ML IV SOLN
INTRAVENOUS | Status: DC | PRN
  Administered 2020-09-09 (×2): 50 mg via INTRAVENOUS

## 2020-09-09 MED ORDER — CHLORHEXIDINE GLUCONATE CLOTH 2 % EX PADS: 6.0000 | MEDICATED_PAD | Freq: Once | CUTANEOUS | Status: DC

## 2020-09-09 MED ORDER — FAMOTIDINE 20 MG PO TABS
ORAL_TABLET | ORAL | Status: AC
  Administered 2020-09-09: 20 mg via ORAL
  Filled 2020-09-09: qty 1

## 2020-09-09 MED ORDER — FENTANYL CITRATE (PF) 100 MCG/2ML IJ SOLN: 25.0000 ug | INTRAMUSCULAR | Status: DC | PRN

## 2020-09-09 MED ORDER — FAMOTIDINE 20 MG PO TABS: 20.0000 mg | ORAL_TABLET | Freq: Once | ORAL | Status: AC

## 2020-09-09 MED ORDER — CEFAZOLIN SODIUM-DEXTROSE 2-4 GM/100ML-% IV SOLN
INTRAVENOUS | Status: AC
  Filled 2020-09-09: qty 100

## 2020-09-09 MED ORDER — IBUPROFEN 800 MG PO TABS: 800.0000 mg | ORAL_TABLET | Freq: Three times a day (TID) | ORAL | 0 refills | Status: AC | PRN

## 2020-09-09 MED ORDER — PROPOFOL 10 MG/ML IV BOLUS
INTRAVENOUS | Status: AC
  Filled 2020-09-09: qty 40

## 2020-09-09 SURGICAL SUPPLY — 35 items
ADH SKN CLS APL DERMABOND .7 (GAUZE/BANDAGES/DRESSINGS) ×1
APL PRP STRL LF DISP 70% ISPRP (MISCELLANEOUS) ×1
BLADE SURG 15 STRL LF DISP TIS (BLADE) ×1 IMPLANT
BLADE SURG 15 STRL SS (BLADE) ×2
CANISTER SUCT 1200ML W/VALVE (MISCELLANEOUS) ×2 IMPLANT
CHLORAPREP W/TINT 26 (MISCELLANEOUS) ×2 IMPLANT
COVER WAND RF STERILE (DRAPES) ×2 IMPLANT
DERMABOND ADVANCED (GAUZE/BANDAGES/DRESSINGS) ×1
DERMABOND ADVANCED .7 DNX12 (GAUZE/BANDAGES/DRESSINGS) ×1 IMPLANT
DRAIN PENROSE 5/8X18 LTX STRL (WOUND CARE) ×2 IMPLANT
DRAPE LAPAROTOMY 77X122 PED (DRAPES) ×2 IMPLANT
ELECT CAUTERY BLADE TIP 2.5 (TIP) ×2
ELECT REM PT RETURN 9FT ADLT (ELECTROSURGICAL) ×2
ELECTRODE CAUTERY BLDE TIP 2.5 (TIP) ×1 IMPLANT
ELECTRODE REM PT RTRN 9FT ADLT (ELECTROSURGICAL) ×1 IMPLANT
GLOVE BIO SURGEON STRL SZ 6.5 (GLOVE) ×2 IMPLANT
GLOVE INDICATOR 7.0 STRL GRN (GLOVE) ×4 IMPLANT
GOWN STRL REUS W/ TWL LRG LVL3 (GOWN DISPOSABLE) ×2 IMPLANT
GOWN STRL REUS W/TWL LRG LVL3 (GOWN DISPOSABLE) ×4
KIT TURNOVER KIT A (KITS) ×2 IMPLANT
LABEL OR SOLS (LABEL) ×2 IMPLANT
MESH VENTRALEX ST 2.5 CRC MED (Mesh General) ×1 IMPLANT
NEEDLE HYPO 22GX1.5 SAFETY (NEEDLE) ×2 IMPLANT
NS IRRIG 500ML POUR BTL (IV SOLUTION) ×2 IMPLANT
PACK BASIN MINOR (MISCELLANEOUS) ×2 IMPLANT
STRIP CLOSURE SKIN 1/2X4 (GAUZE/BANDAGES/DRESSINGS) ×2 IMPLANT
SUT ETHIBOND NAB MO 7 #0 18IN (SUTURE) ×2 IMPLANT
SUT MNCRL 4-0 (SUTURE) ×2
SUT MNCRL 4-0 27XMFL (SUTURE) ×1
SUT VIC AB 3-0 SH 27 (SUTURE) ×2
SUT VIC AB 3-0 SH 27X BRD (SUTURE) ×1 IMPLANT
SUT VICRYL 2-0 SH 8X27 (SUTURE) ×2 IMPLANT
SUTURE MNCRL 4-0 27XMF (SUTURE) ×1 IMPLANT
SYR 10ML LL (SYRINGE) ×2 IMPLANT
SYR BULB IRRIG 60ML STRL (SYRINGE) ×2 IMPLANT

## 2020-09-09 NOTE — Anesthesia Procedure Notes (Signed)
Procedure Name: Intubation Date/Time: 09/09/2020 7:40 AM Performed by: Gayland Curry, CRNA Pre-anesthesia Checklist: Patient identified, Emergency Drugs available, Suction available and Patient being monitored Patient Re-evaluated:Patient Re-evaluated prior to induction Oxygen Delivery Method: Circle system utilized Preoxygenation: Pre-oxygenation with 100% oxygen Induction Type: IV induction Ventilation: Mask ventilation without difficulty Laryngoscope Size: Mac and 4 Grade View: Grade I Tube type: Oral Tube size: 7.0 mm Number of attempts: 1 Placement Confirmation: ETT inserted through vocal cords under direct vision,  positive ETCO2 and breath sounds checked- equal and bilateral Secured at: 21.5 cm Tube secured with: Tape Dental Injury: Teeth and Oropharynx as per pre-operative assessment

## 2020-09-09 NOTE — Op Note (Signed)
Operative Note  Preoperative Diagnosis: Incisional hernia  Postoperative Diagnosis: Same  Operation: Open incisional hernia repair with mesh  Surgeon: Fredirick Maudlin, MD  Assistant: None  Anesthesia: General endotracheal  Findings: The fascial incision from his prior laparoscopic appendectomy appeared to have never actually healed.  I could not see any evidence of the sutures I had placed at the time of his appendectomy.  There was no true hernia sac, simply an open gap in the fascia.  Indications: This is a 69 year old man who underwent a laparoscopic appendectomy for perforated appendicitis in June of this year.  He contacted our office at the end of August, stating he felt like he had a hernia.  Physical examination demonstrated an obvious bulge at the periumbilical trocar site.  Surgical repair was advised.  The risks were discussed with the patient, and he agreed to proceed.  Procedure In Detail: The patient was identified in the preoperative holding area.  He was brought to the operating room and placed supine on the OR table.  Bony prominences were padded and bilateral sequential compression devices were placed on the lower extremities.  General endotracheal anesthesia was induced without incident.  The patient was positioned appropriately for the operation and sterilely prepped and draped in standard fashion.  A timeout was performed confirming the patient's identity, the procedure being performed, his allergies, all necessary equipment was available, and that maintenance anesthesia was adequate.  Perioperative antibiotics were administered.  A one-to-one mixture of 0.25% bupivacaine and 1% lidocaine with epinephrine was infiltrated along the skin and subcutaneous tissues of his prior midline periumbilical incision site.  The skin was then opened sharply.  Dissection was carried down to the fascia using electrocautery.  There was an open gap in the fascia without any obvious hernia sac.   Bowel was visible beneath this opening.  The fascia was cleared circumferentially.  A medium VentraLex mesh was then secured to the fascia using the cardinal points and 0 Ethibond suture.  It was then parachuted into the wound and snugged up against the underside of the abdominal wall.  The sutures were tied down.  The tails of the mesh were then cut.  The fascia was reapproximated over the mesh with interrupted 0 Ethibond, taking a small bite of the mesh with each stitch.  Liposomal bupivacaine was then injected along the fascial planes.  The wound was irrigated and good hemostasis assured.  The wound was then closed in 2 layers, using a deep dermal 3-0 Vicryl and a running subcuticular 4-0 Monocryl.  The skin was cleaned.  Dermabond and Steri-Strips were applied.  The patient was awakened, extubated, and taken to the postanesthesia care unit in good condition.  EBL: Less than 1 cc  IVF: See anesthesia record  Specimen(s): None  Complications: none immediately apparent.   Counts: all needles, instruments, and sponges were counted and reported to be correct in number at the end of the case.   I was present for and participated in the entire operation.  Fredirick Maudlin 8:38 AM

## 2020-09-09 NOTE — Transfer of Care (Signed)
Immediate Anesthesia Transfer of Care Note  Patient: KAEO JACOME  Procedure(s) Performed: HERNIA REPAIR INCISIONAL, open (N/A )  Patient Location: PACU  Anesthesia Type:General  Level of Consciousness: drowsy and patient cooperative  Airway & Oxygen Therapy: Patient Spontanous Breathing and Patient connected to nasal cannula oxygen  Post-op Assessment: Report given to RN and Post -op Vital signs reviewed and stable  Post vital signs: Reviewed and stable  Last Vitals:  Vitals Value Taken Time  BP 127/81 09/09/20 0833  Temp    Pulse 78 09/09/20 0835  Resp 14 09/09/20 0835  SpO2 99 % 09/09/20 0835  Vitals shown include unvalidated device data.  Last Pain:  Vitals:   09/09/20 0626  TempSrc: Oral  PainSc: 0-No pain         Complications: No complications documented.

## 2020-09-09 NOTE — Interval H&P Note (Signed)
History and Physical Interval Note:  09/09/2020 7:17 AM  Brad Cummings  has presented today for surgery, with the diagnosis of Ventral incisional hernia.  The various methods of treatment have been discussed with the patient and family. After consideration of risks, benefits and other options for treatment, the patient has consented to  Procedure(s): HERNIA REPAIR INCISIONAL, open (N/A) as a surgical intervention.  The patient's history has been reviewed, patient examined, no change in status, stable for surgery.  I have reviewed the patient's chart and labs.  Questions were answered to the patient's satisfaction.     Fredirick Maudlin

## 2020-09-09 NOTE — Anesthesia Preprocedure Evaluation (Signed)
Anesthesia Evaluation  Patient identified by MRN, date of birth, ID band Patient awake    Reviewed: Allergy & Precautions, H&P , NPO status , Patient's Chart, lab work & pertinent test results, reviewed documented beta blocker date and time   Airway Mallampati: II  TM Distance: >3 FB Neck ROM: full    Dental  (+) Teeth Intact   Pulmonary neg pulmonary ROS,    Pulmonary exam normal        Cardiovascular Exercise Tolerance: Good hypertension, On Medications negative cardio ROS Normal cardiovascular exam Rate:Normal     Neuro/Psych PSYCHIATRIC DISORDERS Anxiety Depression  Neuromuscular disease    GI/Hepatic negative GI ROS, Neg liver ROS,   Endo/Other  negative endocrine ROS  Renal/GU negative Renal ROS  negative genitourinary   Musculoskeletal   Abdominal   Peds  Hematology negative hematology ROS (+)   Anesthesia Other Findings   Reproductive/Obstetrics negative OB ROS                             Anesthesia Physical Anesthesia Plan  ASA: II  Anesthesia Plan: General LMA   Post-op Pain Management:    Induction:   PONV Risk Score and Plan:   Airway Management Planned:   Additional Equipment:   Intra-op Plan:   Post-operative Plan:   Informed Consent: I have reviewed the patients History and Physical, chart, labs and discussed the procedure including the risks, benefits and alternatives for the proposed anesthesia with the patient or authorized representative who has indicated his/her understanding and acceptance.       Plan Discussed with: CRNA  Anesthesia Plan Comments:         Anesthesia Quick Evaluation

## 2020-09-09 NOTE — Discharge Instructions (Addendum)
Open Hernia Repair, Adult, Care After This sheet gives you information about how to care for yourself after your procedure. Your health care provider may also give you more specific instructions. If you have problems or questions, contact your health care provider. What can I expect after the procedure? After the procedure, it is common to have:  Mild discomfort.  Slight bruising.  Minor swelling.  Pain in the abdomen. Follow these instructions at home: Incision care   Follow instructions from your health care provider about how to take care of your incision area. Make sure you: ? Wash your hands with soap and water before you change your bandage (dressing). If soap and water are not available, use hand sanitizer. ? Change your dressing as told by your health care provider. ? Leave stitches (sutures), skin glue, or adhesive strips in place. These skin closures may need to stay in place for 2 weeks or longer. If adhesive strip edges start to loosen and curl up, you may trim the loose edges. Do not remove adhesive strips completely unless your health care provider tells you to do that.  Check your incision area every day for signs of infection. Check for: ? More redness, swelling, or pain. ? More fluid or blood. ? Warmth. ? Pus or a bad smell. Activity  Do not drive or use heavy machinery while taking prescription pain medicine. Do not drive until your health care provider approves.  Until your health care provider approves: ? Do not lift anything that is heavier than 10 lb (4.5 kg). ? Do not play contact sports.  Return to your normal activities as told by your health care provider. Ask your health care provider what activities are safe. General instructions  To prevent or treat constipation while you are taking prescription pain medicine, your health care provider may recommend that you: ? Drink enough fluid to keep your urine clear or pale yellow. ? Take over-the-counter or  prescription medicines. ? Eat foods that are high in fiber, such as fresh fruits and vegetables, whole grains, and beans. ? Limit foods that are high in fat and processed sugars, such as fried and sweet foods.  Take over-the-counter and prescription medicines only as told by your health care provider.  Do not take tub baths or go swimming until your health care provider approves.  Keep all follow-up visits as told by your health care provider. This is important. Contact a health care provider if:  You develop a rash.  You have more redness, swelling, or pain around your incision.  You have more fluid or blood coming from your incision.  Your incision feels warm to the touch.  You have pus or a bad smell coming from your incision.  You have a fever or chills.  You have blood in your stool (feces).  You have not had a bowel movement in 2-3 days.  Your pain is not controlled with medicine. Get help right away if:  You have chest pain or shortness of breath.  You feel light-headed or feel faint.  You have severe pain.  You vomit and your pain is worse. This information is not intended to replace advice given to you by your health care provider. Make sure you discuss any questions you have with your health care provider. Document Revised: 11/29/2017 Document Reviewed: 05/30/2016 Elsevier Patient Education  2020 Elsevier Inc.   Bupivacaine Liposomal Suspension for Injection What is this medicine? BUPIVACAINE LIPOSOMAL (bue PIV a kane LIP oh som al) is   an anesthetic. It causes loss of feeling in the skin or other tissues. It is used to prevent and to treat pain from some procedures. This medicine may be used for other purposes; ask your health care provider or pharmacist if you have questions. COMMON BRAND NAME(S): EXPAREL What should I tell my health care provider before I take this medicine? They need to know if you have any of these conditions:  G6PD deficiency  heart  disease  kidney disease  liver disease  low blood pressure  lung or breathing disease, like asthma  an unusual or allergic reaction to bupivacaine, other medicines, foods, dyes, or preservatives  pregnant or trying to get pregnant  breast-feeding How should I use this medicine? This medicine is for injection into the affected area. It is given by a health care professional in a hospital or clinic setting. Talk to your pediatrician regarding the use of this medicine in children. Special care may be needed. Overdosage: If you think you have taken too much of this medicine contact a poison control center or emergency room at once. NOTE: This medicine is only for you. Do not share this medicine with others. What if I miss a dose? This does not apply. What may interact with this medicine? This medicine may interact with the following medications:  acetaminophen  certain antibiotics like dapsone, nitrofurantoin, aminosalicylic acid, sulfonamides  certain medicines for seizures like phenobarbital, phenytoin, valproic acid  chloroquine  cyclophosphamide  flutamide  hydroxyurea  ifosfamide  metoclopramide  nitric oxide  nitroglycerin  nitroprusside  nitrous oxide  other local anesthetics like lidocaine, pramoxine, tetracaine  primaquine  quinine  rasburicase  sulfasalazine This list may not describe all possible interactions. Give your health care provider a list of all the medicines, herbs, non-prescription drugs, or dietary supplements you use. Also tell them if you smoke, drink alcohol, or use illegal drugs. Some items may interact with your medicine. What should I watch for while using this medicine? Your condition will be monitored carefully while you are receiving this medicine. Be careful to avoid injury while the area is numb, and you are not aware of pain. What side effects may I notice from receiving this medicine? Side effects that you should report  to your doctor or health care professional as soon as possible:  allergic reactions like skin rash, itching or hives, swelling of the face, lips, or tongue  seizures  signs and symptoms of a dangerous change in heartbeat or heart rhythm like chest pain; dizziness; fast, irregular heartbeat; palpitations; feeling faint or lightheaded; falls; breathing problems  signs and symptoms of methemoglobinemia such as pale, gray, or blue colored skin; headache; fast heartbeat; shortness of breath; feeling faint or lightheaded, falls; tiredness Side effects that usually do not require medical attention (report to your doctor or health care professional if they continue or are bothersome):  anxious  back pain  changes in taste  changes in vision  constipation  dizziness  fever  nausea, vomiting This list may not describe all possible side effects. Call your doctor for medical advice about side effects. You may report side effects to FDA at 1-800-FDA-1088. Where should I keep my medicine? This drug is given in a hospital or clinic and will not be stored at home. NOTE: This sheet is a summary. It may not cover all possible information. If you have questions about this medicine, talk to your doctor, pharmacist, or health care provider.  2020 Elsevier/Gold Standard (2019-09-29 10:48:23)      AMBULATORY SURGERY  DISCHARGE INSTRUCTIONS   1) The drugs that you were given will stay in your system until tomorrow so for the next 24 hours you should not:  A) Drive an automobile B) Make any legal decisions C) Drink any alcoholic beverage   2) You may resume regular meals tomorrow.  Today it is better to start with liquids and gradually work up to solid foods.  You may eat anything you prefer, but it is better to start with liquids, then soup and crackers, and gradually work up to solid foods.   3) Please notify your doctor immediately if you have any unusual bleeding, trouble breathing,  redness and pain at the surgery site, drainage, fever, or pain not relieved by medication.    4) Additional Instructions:        Please contact your physician with any problems or Same Day Surgery at [phone], Monday through Friday 6 am to 4 pm, or [facility] number at [phone]. 

## 2020-09-13 NOTE — Anesthesia Postprocedure Evaluation (Signed)
Anesthesia Post Note  Patient: Brad Cummings  Procedure(s) Performed: HERNIA REPAIR INCISIONAL, open (N/A )  Patient location during evaluation: PACU Anesthesia Type: General Level of consciousness: awake and alert Pain management: pain level controlled Vital Signs Assessment: post-procedure vital signs reviewed and stable Respiratory status: spontaneous breathing, nonlabored ventilation, respiratory function stable and patient connected to nasal cannula oxygen Cardiovascular status: blood pressure returned to baseline and stable Postop Assessment: no apparent nausea or vomiting Anesthetic complications: no   No complications documented.   Last Vitals:  Vitals:   09/09/20 1014 09/09/20 1045  BP: 120/68 118/67  Pulse:    Resp: 20 20  Temp:    SpO2: 97% 98%    Last Pain:  Vitals:   09/09/20 1045  TempSrc:   PainSc: 0-No pain                 Molli Barrows

## 2020-09-21 NOTE — Progress Notes (Addendum)
error 

## 2020-09-22 ENCOUNTER — Other Ambulatory Visit: Payer: Self-pay

## 2020-09-22 ENCOUNTER — Ambulatory Visit (INDEPENDENT_AMBULATORY_CARE_PROVIDER_SITE_OTHER): Payer: 59 | Admitting: Urology

## 2020-09-22 ENCOUNTER — Encounter: Payer: Self-pay | Admitting: Urology

## 2020-09-22 VITALS — BP 148/83 | HR 103 | Ht 71.0 in | Wt 170.0 lb

## 2020-09-22 DIAGNOSIS — Z87898 Personal history of other specified conditions: Secondary | ICD-10-CM

## 2020-09-22 DIAGNOSIS — N5201 Erectile dysfunction due to arterial insufficiency: Secondary | ICD-10-CM

## 2020-09-22 DIAGNOSIS — E291 Testicular hypofunction: Secondary | ICD-10-CM

## 2020-09-22 NOTE — Progress Notes (Signed)
09/22/2020 11:36 AM   Brad Cummings December 14, 1951 [id number]  Referring provider: Remi Haggard, [place],  [place] [address] Chief Complaint  Patient presents with  . Hypogonadism    HPI: Brad Cummings is a 69 y.o. male who presents today for a 9 month follow up of hypogonadism and erectile dysfunction.  -Refer to my previous note 12/2019 for a clinical summary -He has hypogonadism on TRT injecting 100 mg weekly. -Patient underwent a laparoscopic appendectomy on 06/11/20 with Dr. Celine Ahr.  -He feels depressed and has low energy. -He has increased anxiety. -He feels like his symptoms are worse than when he was initially seen. -Symptoms are effecting his quality of life.  --Testosterone levels have been in the 4-500 range and he does not feel this is high enough and is requesting to increase the dose -His last injection was last Tuesday.    PMH: Past Medical History:  Diagnosis Date  . Anxiety   . Colon polyp   . HLD (hyperlipidemia)   . HTN (hypertension)   . Hx of colonic polyps   . Hypogonadism in male   . Staph infection   . Syncope   . Ventral hernia     Surgical History: Past Surgical History:  Procedure Laterality Date  . COLONOSCOPY W/ BIOPSIES    . INCISIONAL HERNIA REPAIR N/A 09/09/2020   Procedure: HERNIA REPAIR INCISIONAL, open;  Surgeon: Fredirick Maudlin, MD;  Location: [facility] ORS;  Service: General;  Laterality: N/A;  . knee scope Left 1989  . LAPAROSCOPIC APPENDECTOMY N/A 06/11/2020   Procedure: APPENDECTOMY LAPAROSCOPIC;  Surgeon: Fredirick Maudlin, MD;  Location: [facility] ORS;  Service: General;  Laterality: N/A;  . LAPAROSCOPY     umbilical hernia  . umbilcal hernia repair N/A     Home Medications:  Allergies as of 09/22/2020      Reactions   Penicillins    "Really can't remember as it was from childhood."      Medication List       Accurate as of September 22, 2020 11:36 AM. If you have any questions, ask your nurse or  doctor.        acetaminophen 500 MG tablet Commonly known as: TYLENOL Take 1,000 mg by mouth every 6 (six) hours as needed (pain.).   ALPRAZolam 1 MG tablet Commonly known as: XANAX Take 0.5-1 mg by mouth See admin instructions. Take 1 tablet (1 mg) by mouth in the morning, take 0.5 tablet (0.5 mg) by mouth in the afternoon, & take 0.5 tablet (0.5 mg) by mouth at bedtime.   atorvastatin 40 MG tablet Commonly known as: LIPITOR Take 1 tablet (40 mg total) by mouth daily. What changed: when to take this   busPIRone 10 MG tablet Commonly known as: BUSPAR Take 10 mg by mouth every morning.   citalopram 20 MG tablet Commonly known as: CELEXA Take 20 mg by mouth every morning.   fluticasone 50 MCG/ACT nasal spray Commonly known as: FLONASE USE 2 SPRAYS IN EACH NOSTRIL EVERY DAY   folic acid 1 MG tablet Commonly known as: FOLVITE Take 1 tablet (1 mg total) by mouth daily.   ibuprofen 800 MG tablet Commonly known as: ADVIL Take 1 tablet (800 mg total) by mouth every 8 (eight) hours as needed.   lisinopril-hydrochlorothiazide 20-12.5 MG tablet Commonly known as: ZESTORETIC TAKE ONE TABLET EVERY MORNING What changed: when to take this   Magnesium Oxide 400 MG Caps Take 1 capsule (400 mg total) by mouth  in the morning and at bedtime.   multivitamin with minerals Tabs tablet Take 1 tablet by mouth daily.   Nexlizet 180-10 MG Tabs Generic drug: Bempedoic Acid-Ezetimibe Take 1 tablet by mouth every morning.   oxyCODONE 5 MG immediate release tablet Commonly known as: Oxy IR/ROXICODONE Take 1 tablet (5 mg total) by mouth every 6 (six) hours as needed for severe pain.   testosterone cypionate 200 MG/ML injection Commonly known as: DEPOTESTOSTERONE CYPIONATE Inject 0.5 mLs (100 mg total) into the muscle once a week. What changed: when to take this   thiamine 100 MG tablet Take 1 tablet (100 mg total) by mouth daily.       Allergies:  Allergies  Allergen Reactions    . Penicillins     "Really can't remember as it was from childhood."    Family History: Family History  Problem Relation Age of Onset  . Colon polyps Brother   . Colon polyps Father   . Diabetes Father   . Heart disease Father   . Colon cancer Brother 58       removed a foot of his colon  . Heart disease Brother     Social History:  reports that he has never smoked. He has never used smokeless tobacco. He reports current alcohol use of about 8.0 standard drinks of alcohol per week. He reports current drug use. Drug: Marijuana.   Physical Exam: BP (!) 148/83   Pulse (!) 103   Ht 5\' 11"  (1.803 m)   Wt 170 lb (77.1 kg)   BMI 23.71 kg/m   Constitutional:  Alert and oriented, No acute distress. HEENT: [place] AT, moist mucus membranes.  Trachea midline, no masses. Cardiovascular: No clubbing, cyanosis, or edema. Respiratory: Normal respiratory effort, no increased work of breathing. GU: Declined DRE today due to recent surgery Skin: No rashes, bruises or suspicious lesions. Neurologic: Grossly intact, no focal deficits, moving all 4 extremities. Psychiatric: Normal mood and affect.  Laboratory Data:  Lab Results  Component Value Date   CREATININE 1.20 08/01/2020    Lab Results  Component Value Date   TESTOSTERONE 483 06/01/2020    Assessment & Plan:    1. Hypogonadism Patient would like to start receiving testosterone injections in clinic. Will draw testosterone and hematocrit today and call with results. May be able to titrate his dose higher however will not be able to have testosterone levels in abnormal range  2. Erectile dysfunction On Sildenafil  3.  History elevated PSA PSA as 3.6 on 12/23/2019.  PSA today.    [facility] [address], [place], [place] [address] [phone]  I, Selena Batten, am acting as a scribe for Dr. Nicki Reaper C. Berish Bohman,  I have reviewed the above documentation for accuracy and  completeness, and I agree with the above.   Abbie Sons, MD

## 2020-09-23 ENCOUNTER — Telehealth: Payer: Self-pay | Admitting: *Deleted

## 2020-09-23 LAB — PSA: Prostate Specific Ag, Serum: 4.9 ng/mL — ABNORMAL HIGH (ref 0.0–4.0)

## 2020-09-23 LAB — TESTOSTERONE: Testosterone: 782 ng/dL (ref 264–916)

## 2020-09-23 LAB — HEMATOCRIT: Hematocrit: 47.5 % (ref 37.5–51.0)

## 2020-09-23 NOTE — Telephone Encounter (Signed)
-----   Message from Abbie Sons, MD sent at 09/23/2020  3:03 PM EDT ----- PSA was 4.9, hematocrit 47.5, testosterone level looks good at 782

## 2020-09-23 NOTE — Telephone Encounter (Signed)
Notified patient as instructed, patient pleased. Discussed follow-up appointments, patient agrees  

## 2020-09-27 ENCOUNTER — Other Ambulatory Visit: Payer: Self-pay

## 2020-09-27 MED ORDER — TESTOSTERONE CYPIONATE 200 MG/ML IM SOLN: 100.0000 mg | INTRAMUSCULAR | 0 refills | Status: DC

## 2020-09-27 NOTE — Progress Notes (Deleted)
Testosterone IM Injection  Patient did not bring medication

## 2020-09-28 NOTE — Progress Notes (Signed)
Error   This encounter was created in error - please disregard. 

## 2020-09-29 ENCOUNTER — Encounter: Payer: 59 | Admitting: Physician Assistant

## 2020-10-04 ENCOUNTER — Other Ambulatory Visit: Payer: Self-pay

## 2020-10-04 ENCOUNTER — Telehealth: Payer: Self-pay | Admitting: Family Medicine

## 2020-10-04 ENCOUNTER — Ambulatory Visit (INDEPENDENT_AMBULATORY_CARE_PROVIDER_SITE_OTHER): Payer: 59 | Admitting: Family Medicine

## 2020-10-04 DIAGNOSIS — E291 Testicular hypofunction: Secondary | ICD-10-CM

## 2020-10-04 MED ORDER — TESTOSTERONE CYPIONATE 200 MG/ML IM SOLN
200.0000 mg | Freq: Once | INTRAMUSCULAR | Status: AC
  Administered 2020-10-04: 200 mg via INTRAMUSCULAR

## 2020-10-04 NOTE — Progress Notes (Signed)
Testosterone IM Injection  Due to Hypogonadism patient is present today for a Testosterone Injection.  Medication: Testosterone Cypionate Dose: 0.71ml Location: left upper outer buttocks Lot: [id number] Exp:08/2022  Patient tolerated well, no complications were noted  Preformed by: Elberta Leatherwood, CMA  Follow up: 1 week

## 2020-10-04 NOTE — Telephone Encounter (Signed)
Patient was in office today for a Testosterone injection. Patient states he is supposed to be getting 40ml injections per last office visit with Dr. Bernardo Heater. The notes in the chart suggest he change his dose from 35ml every 2 weeks to 0.72ml every week. Patient was given 0.40ml today and he states he will be giving his own injections at home now. He has been doing this in the past and he feels he is capable of continuing his own injections. I informed him that he is only to give himself 0.71ml weekly unless informed otherwise. Patient voiced understanding.

## 2020-11-30 DEATH — deceased

## 2021-01-27 IMAGING — CT CT HEAD W/O CM
3 series · 14 of 47 positions shown, 16 images · non-contrast
Comparison: CT head 01/18/2011.  No prior cervical spine imaging.

CLINICAL DATA: 68-year-old with acute onset of dizziness that began
suddenly this morning, who had a syncopal episode thereafter and was
found on the floor upon EMS arrival. Initial encounter.

EXAM:
CT HEAD WITHOUT CONTRAST
CT CERVICAL SPINE WITHOUT CONTRAST
TECHNIQUE: Multidetector CT imaging of the head and cervical spine was
performed following the standard protocol without intravenous
contrast. Multiplanar CT image reconstructions of the cervical spine
were also generated.

[Series 2: head wo · axial · 0.42mm/px · z∈[+359,+489]mm · 8 of 32 slices shown, 10 images]
[im 3/32  brain]
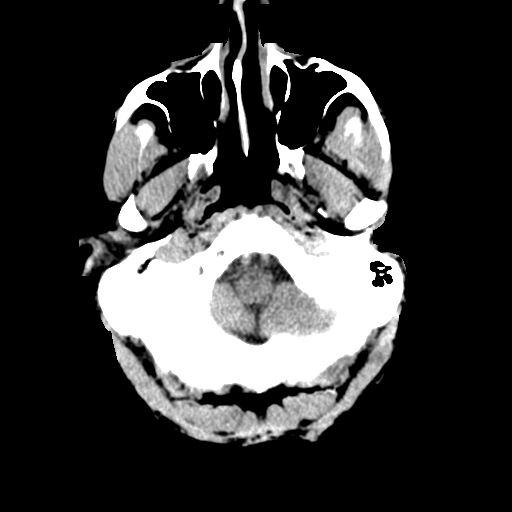
[im 3/32  bone]
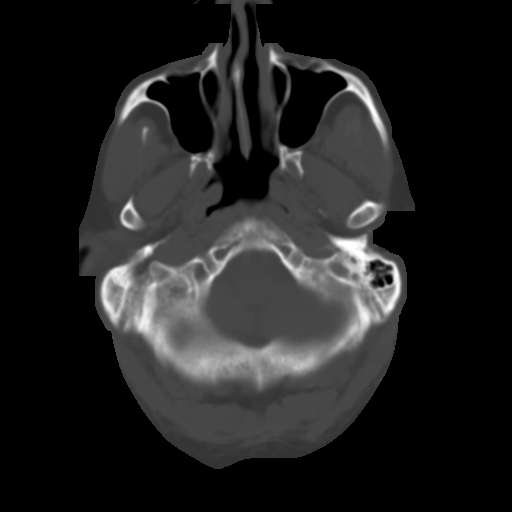
[im 7/32  brain]
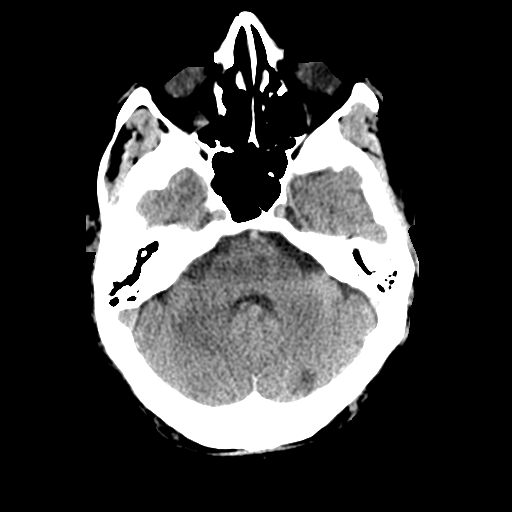
[im 10/32  brain]
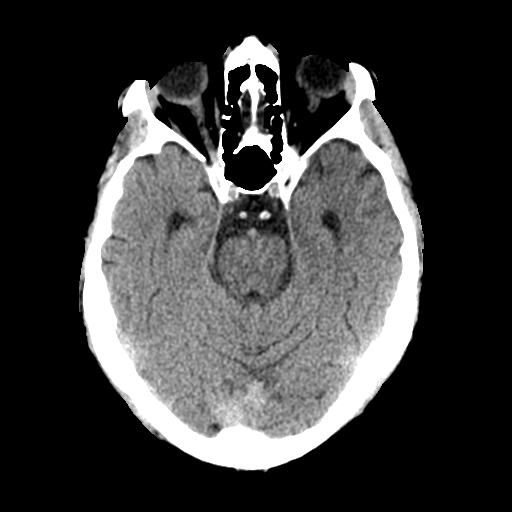
[im 14/32  brain]
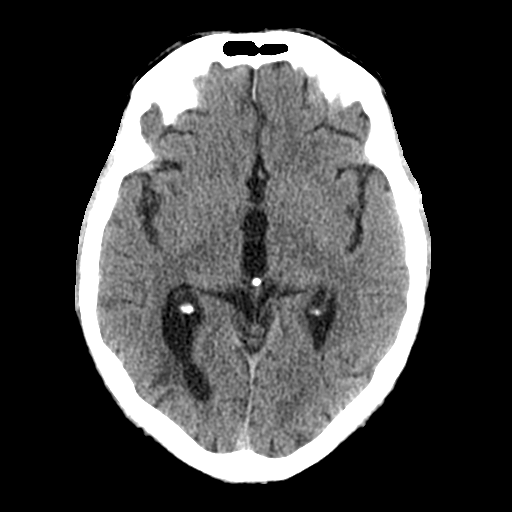
[im 18/32  brain]
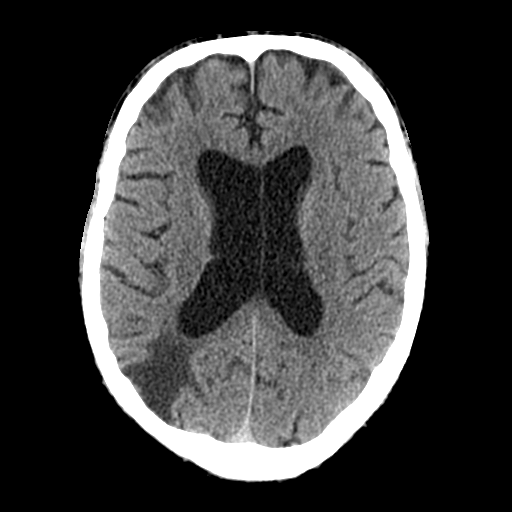
[im 18/32  bone]
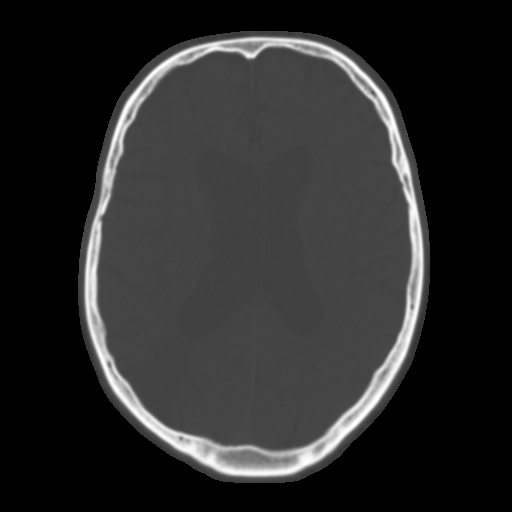
[im 22/32  brain]
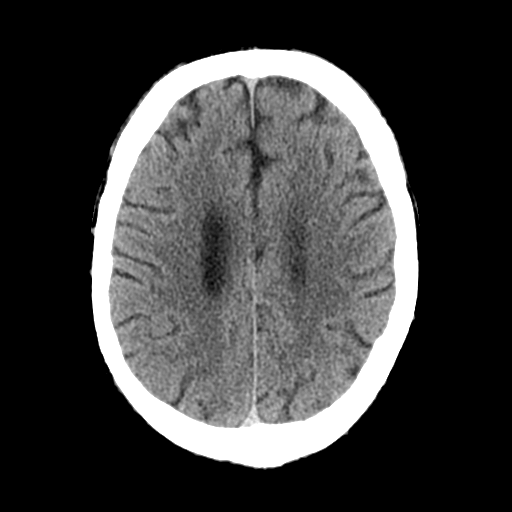
[im 25/32  brain]
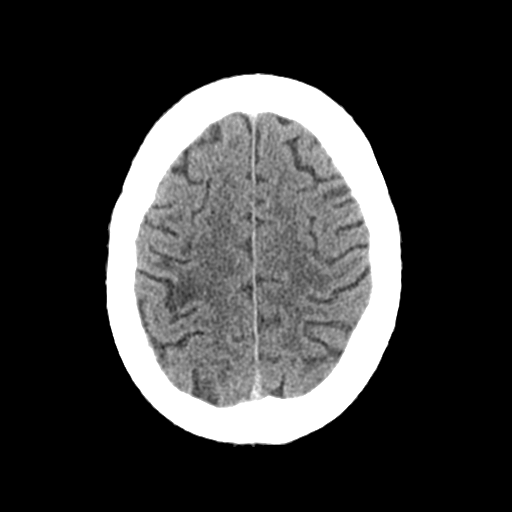
[im 29/32  brain]
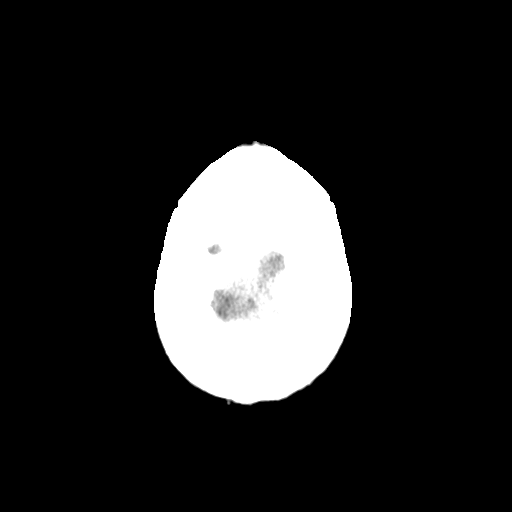

[Series 4: coronal soft tissue · coronal · 0.32mm/px · 3 of 68 slices shown]
[im 23/68  brain]
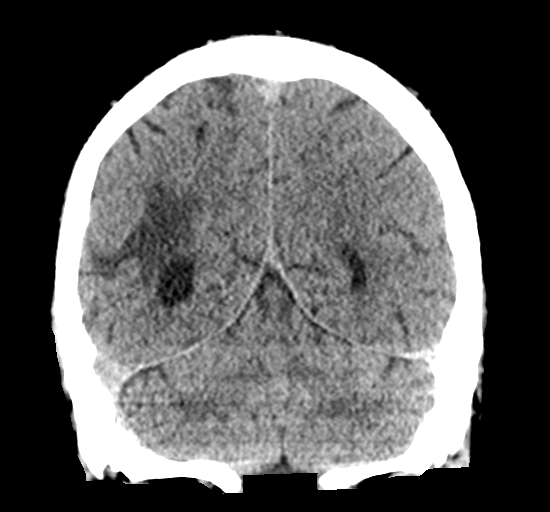
[im 30/68  brain]
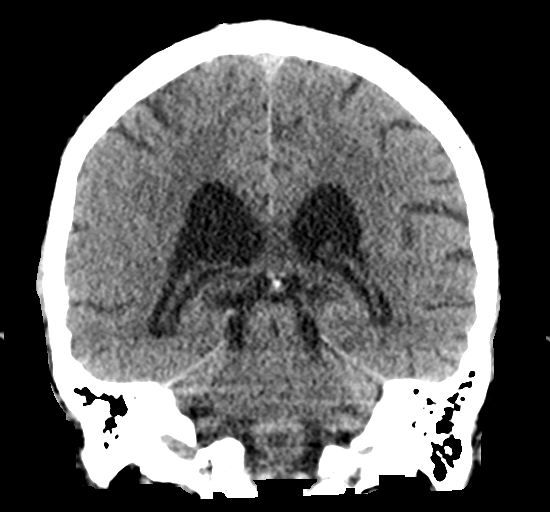
[im 38/68  brain]
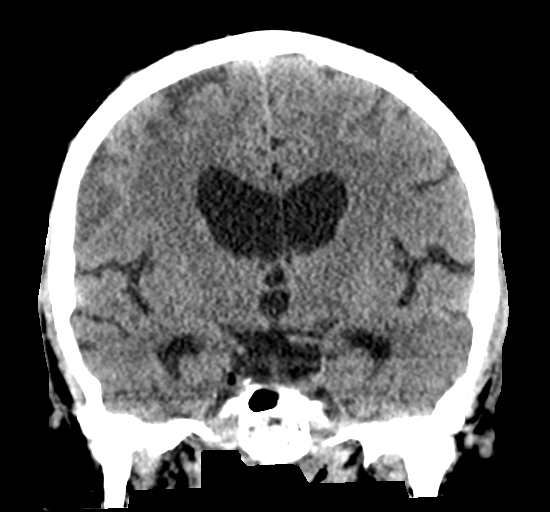

[Series 5: sagittal soft tissue · sagittal · 0.34mm/px · 3 of 55 slices shown]
[im 19/55  brain]
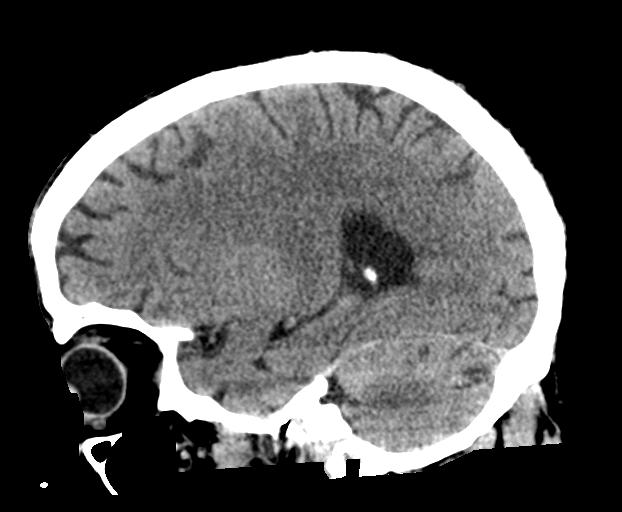
[im 28/55  brain]
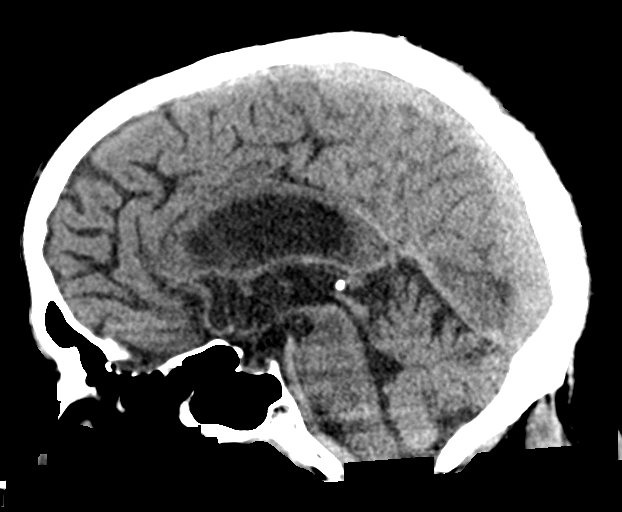
[im 37/55  brain]
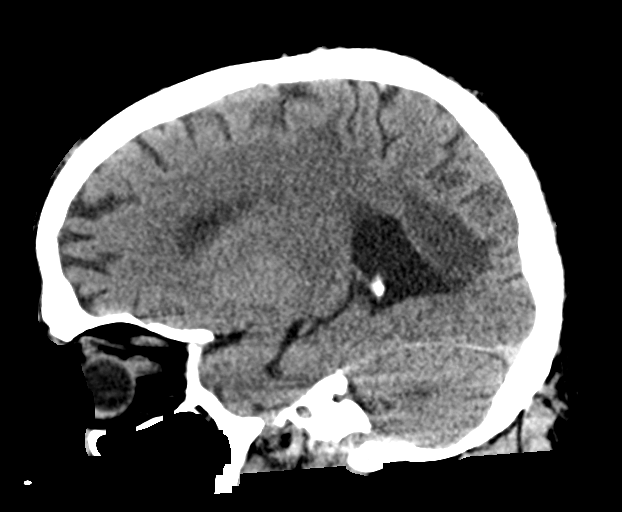

[14 of 47 positions shown; findings below may reference images not displayed]

FINDINGS: CT HEAD FINDINGS

Brain: Mild-to-moderate age related cortical and deep atrophy.
Remote cortical stroke involving the RIGHT posterior parietal lobe
with encephalomalacia. Remote stroke involving the medial LEFT
cerebellum. Remote stroke involving the LEFT superior cerebellum
with encephalomalacia. Mild changes of small vessel disease of the
white matter. No mass lesion or midline shift. No acute hemorrhage
or hematoma. No extra-axial fluid collections.

Vascular: Mild BILATERAL carotid siphon and BILATERAL vertebral
artery atherosclerosis. No hyperdense vessel.

Skull: No skull fracture or other focal osseous abnormality
involving the skull.

Sinuses/Orbits: Visualized paranasal sinuses, bilateral mastoid air
cells and bilateral middle ear cavities well-aerated. Visualized
orbits and globes normal in appearance.

Other: None.

CT CERVICAL SPINE FINDINGS

Alignment: Anatomic posterior alignment. Facet joints anatomically
aligned with degenerative changes throughout.

Skull base and vertebrae: No fractures identified involving the
cervical spine. Coronal reformatted images demonstrate an intact
craniocervical junction, intact dens and intact lateral masses
throughout.

Soft tissues and spinal canal: Borderline multifactorial spinal
stenosis at C6-7. No evidence of canal hematoma.

Disc levels: Large bridging osteophytes anteriorly at C3-4, C4-5,
C5-6 and C6-7. Severe disc space narrowing at C5-6, C6-7 and
moderate disc space narrowing at C7-T1. Facet and uncinate
hypertrophy account for multilevel foraminal stenoses including
moderate to severe LEFT C2-3, severe LEFT and moderate RIGHT C3-4,
severe BILATERAL C4-5, severe RIGHT and moderate LEFT C5-6, severe
BILATERAL C6-7, and mild BILATERAL C7-T1.

Upper chest: Visualized lung apices clear. Visualized superior
mediastinum normal.

Other: Mild BILATERAL cervical carotid atherosclerosis.
IMPRESSION: BRAIN:

1. No acute intracranial abnormality.
2. Remote cortical stroke involving the RIGHT posterior parietal
lobe with encephalomalacia. Remote strokes involving the LEFT
superior cerebellum and the medial LEFT cerebellum.
3. Mild-to-moderate age related generalized atrophy and mild chronic
microvascular ischemic changes of the white matter.

C-SPINE:

1. No cervical spine fractures identified.
2. Multilevel degenerative disc disease, spondylosis and facet
degenerative changes account for multilevel foraminal stenoses
detailed above. Borderline multifactorial spinal stenosis at C6-

## 2021-01-27 IMAGING — CT CT CHEST W/O CM
2 of 3 series · 15 of 36 positions shown, 18 images · non-contrast
Comparison: Chest x-ray from earlier same day.

CLINICAL DATA: Abnormal chest x-ray.  Pulmonary nodule.

EXAM:
CT CHEST WITHOUT CONTRAST
TECHNIQUE: Multidetector CT imaging of the chest was performed following the
standard protocol without IV contrast.

[Series 2: thorax · axial · 0.71mm/px · z∈[-346,-62]mm · 12 of 168 slices shown, 15 images]
[im 13/168  mediastinal]
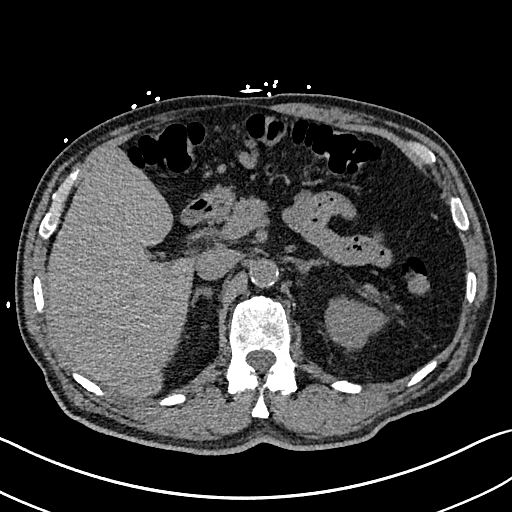
[im 13/168  lung]
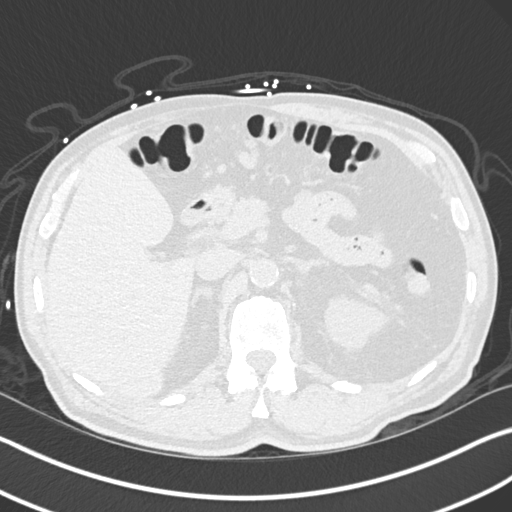
[im 25/168  lung]
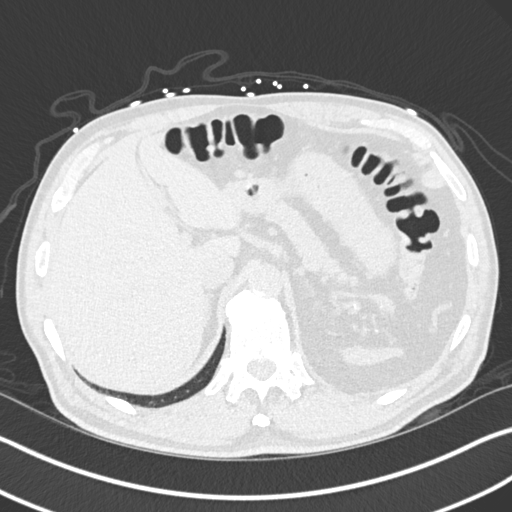
[im 38/168  lung]
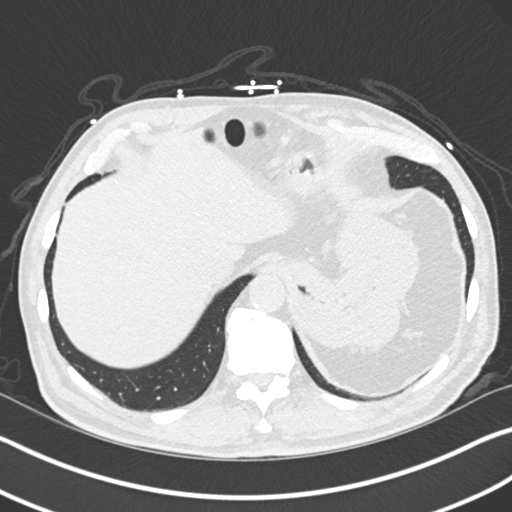
[im 50/168  lung]
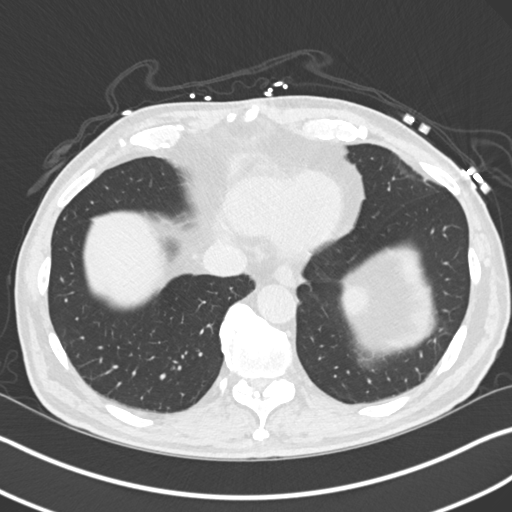
[im 62/168  mediastinal]
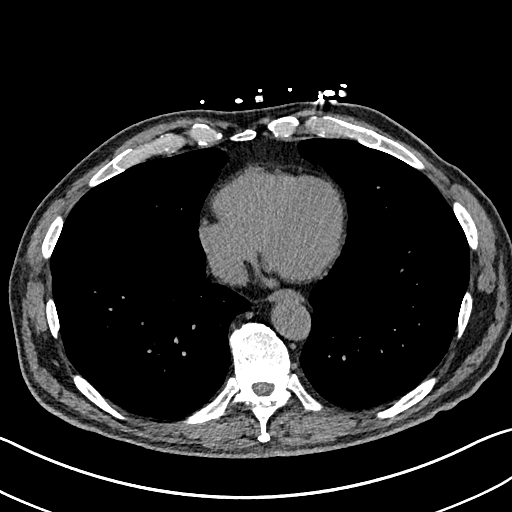
[im 62/168  lung]
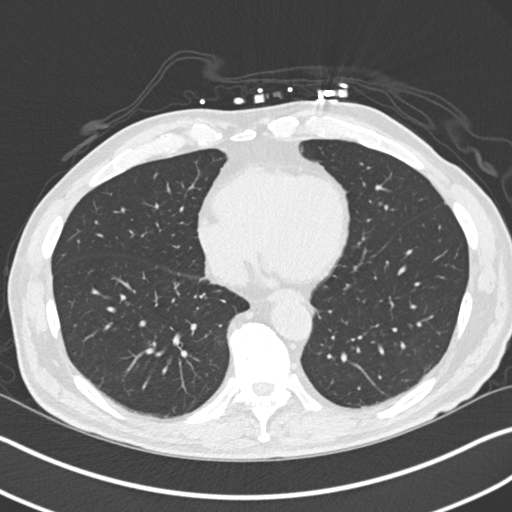
[im 75/168  lung]
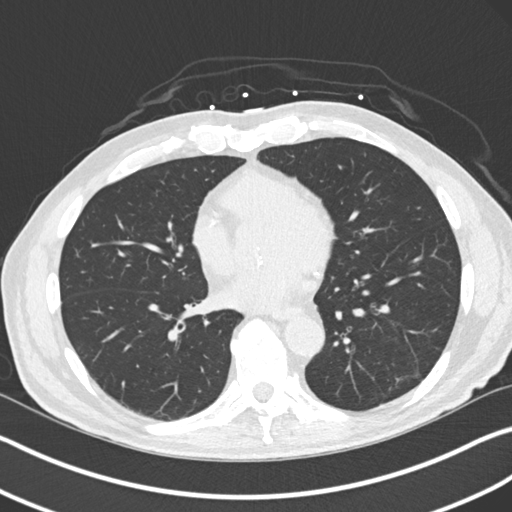
[im 93/168  lung]
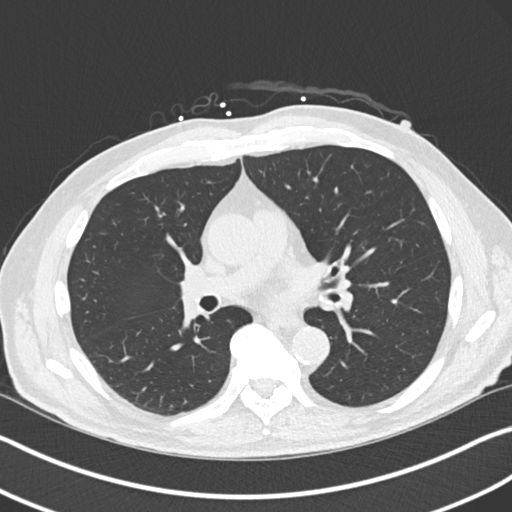
[im 106/168  lung]
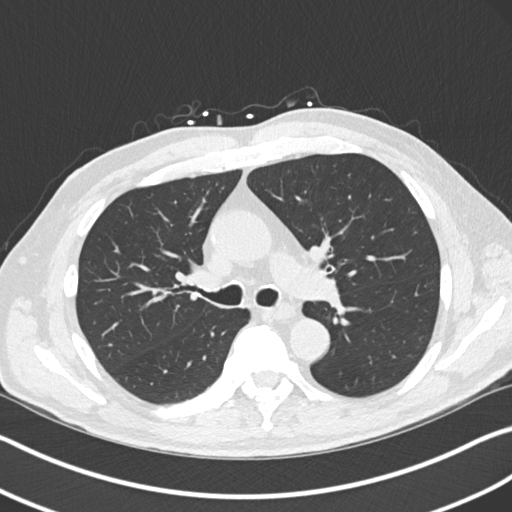
[im 118/168  mediastinal]
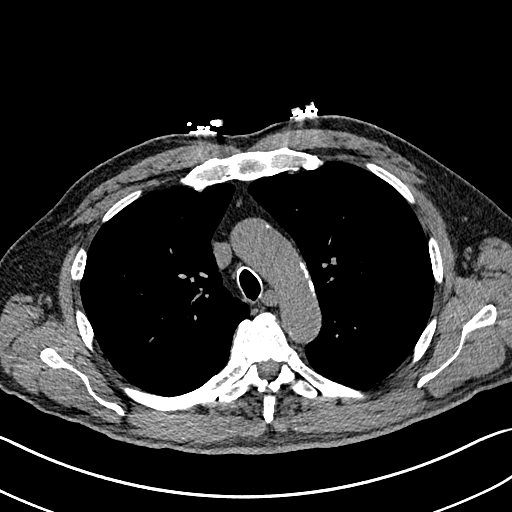
[im 118/168  lung]
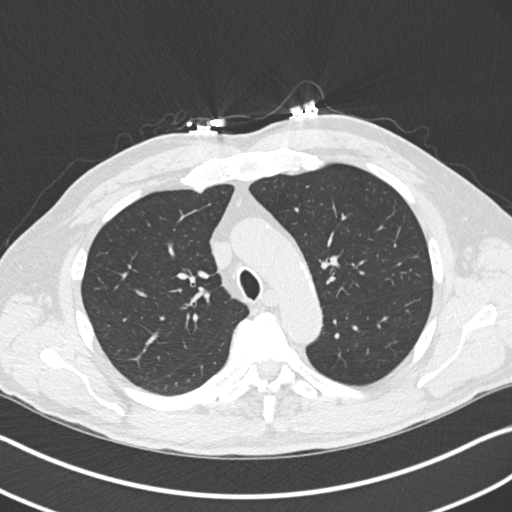
[im 130/168  lung]
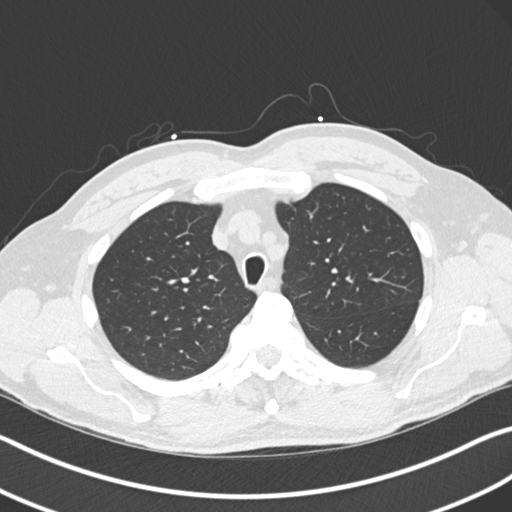
[im 143/168  lung]
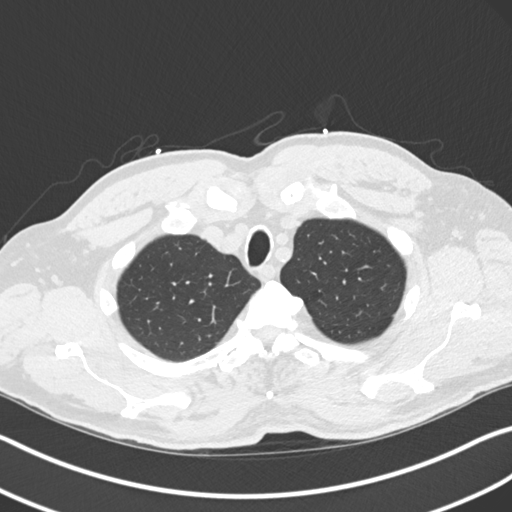
[im 155/168  lung]
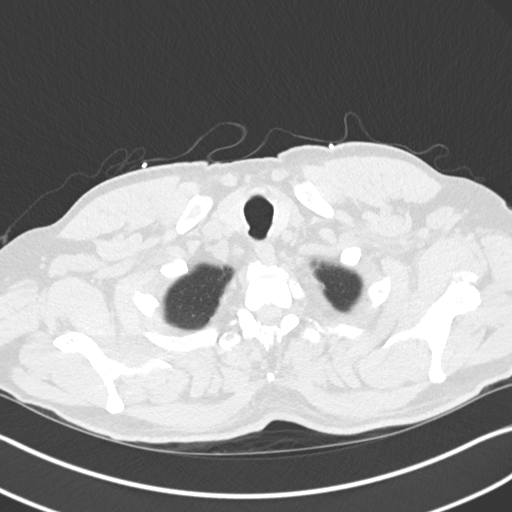

[Series 5: coronal · coronal · 0.67mm/px · 3 of 129 slices shown]
[im 26/129  lung]
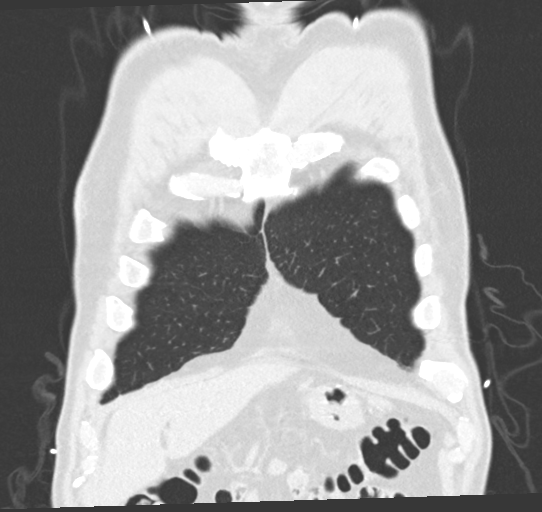
[im 52/129  lung]
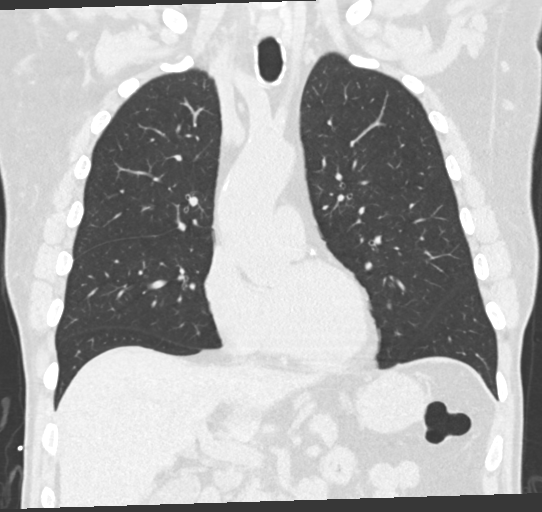
[im 77/129  lung]
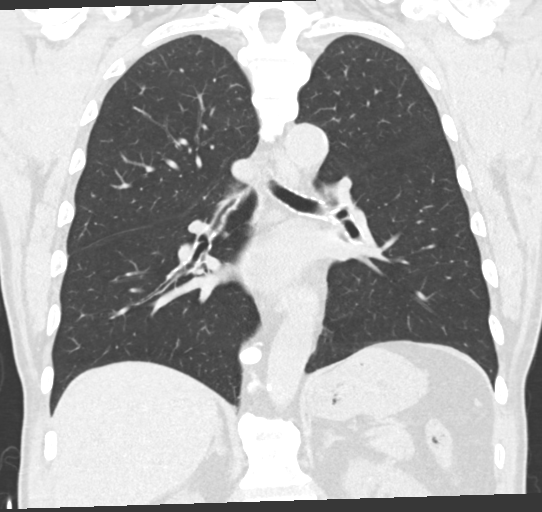

[15 of 36 positions shown; findings below may reference images not displayed]

FINDINGS: Cardiovascular: Heart size is normal. No pericardial effusion.
Three-vessel coronary artery calcifications. Aortic atherosclerosis.
No thoracic aortic aneurysm.

Mediastinum/Nodes: No mass or enlarged lymph nodes are seen within
the mediastinum or perihilar regions. Esophagus appears normal.
Trachea and central bronchi are unremarkable.

Lungs/Pleura: Lungs are clear. No pulmonary nodule or mass. No
pneumonia or pulmonary edema. No pleural effusion.

Upper Abdomen: Limited images of the upper abdomen are unremarkable.

Musculoskeletal: Degenerative spondylosis and ankylosis of the
slightly kyphotic thoracolumbar spine, mild to moderate in degree.
No acute or suspicious osseous finding.
IMPRESSION: 1. No acute findings.
2. Lungs are clear. No pulmonary nodule or mass. The questioned
pulmonary nodule within the LEFT lower lobe on today's earlier chest
x-ray is presumably superimposition of normal soft tissues or nipple
shadow.
3. Coronary artery calcifications.

Aortic Atherosclerosis ([id number]-[id number].G).

## 2021-10-17 ENCOUNTER — Encounter: Payer: Self-pay | Admitting: General Surgery
